# Patient Record
Sex: Female | Born: 1935 | Race: White | Hispanic: No | Smoking: Never smoker
Health system: Southern US, Community
[De-identification: ages and names within clinical notes are randomized; demographics above are authoritative.]

## PROBLEM LIST (undated history)

## (undated) DIAGNOSIS — C50919 Malignant neoplasm of unspecified site of unspecified female breast: Secondary | ICD-10-CM

## (undated) DIAGNOSIS — K5790 Diverticulosis of intestine, part unspecified, without perforation or abscess without bleeding: Secondary | ICD-10-CM

## (undated) DIAGNOSIS — C801 Malignant (primary) neoplasm, unspecified: Secondary | ICD-10-CM

## (undated) DIAGNOSIS — Z0000 Encounter for general adult medical examination without abnormal findings: Secondary | ICD-10-CM

## (undated) DIAGNOSIS — E785 Hyperlipidemia, unspecified: Secondary | ICD-10-CM

## (undated) DIAGNOSIS — H409 Unspecified glaucoma: Secondary | ICD-10-CM

## (undated) DIAGNOSIS — K635 Polyp of colon: Secondary | ICD-10-CM

## (undated) DIAGNOSIS — E039 Hypothyroidism, unspecified: Secondary | ICD-10-CM

## (undated) DIAGNOSIS — I10 Essential (primary) hypertension: Secondary | ICD-10-CM

## (undated) HISTORY — DX: Essential (primary) hypertension: I10

## (undated) HISTORY — DX: Hyperlipidemia, unspecified: E78.5

## (undated) HISTORY — DX: Polyp of colon: K63.5

## (undated) HISTORY — DX: Encounter for general adult medical examination without abnormal findings: Z00.00

## (undated) HISTORY — DX: Unspecified glaucoma: H40.9

## (undated) HISTORY — DX: Malignant (primary) neoplasm, unspecified: C80.1

## (undated) HISTORY — DX: Hypothyroidism, unspecified: E03.9

## (undated) HISTORY — PX: BREAST LUMPECTOMY: SHX2

## (undated) HISTORY — PX: THYROIDECTOMY: SHX17

## (undated) HISTORY — DX: Diverticulosis of intestine, part unspecified, without perforation or abscess without bleeding: K57.90

## (undated) HISTORY — DX: Malignant neoplasm of unspecified site of unspecified female breast: C50.919

---

## 2004-05-19 ENCOUNTER — Encounter: Payer: Self-pay | Admitting: Internal Medicine

## 2007-03-27 LAB — CONVERTED CEMR LAB: Pap Smear: NORMAL

## 2007-04-02 LAB — HM COLONOSCOPY: HM Colonoscopy: NORMAL

## 2008-05-03 ENCOUNTER — Encounter (INDEPENDENT_AMBULATORY_CARE_PROVIDER_SITE_OTHER): Payer: Self-pay | Admitting: *Deleted

## 2008-07-15 ENCOUNTER — Encounter (INDEPENDENT_AMBULATORY_CARE_PROVIDER_SITE_OTHER): Payer: Self-pay | Admitting: *Deleted

## 2008-07-22 ENCOUNTER — Encounter (INDEPENDENT_AMBULATORY_CARE_PROVIDER_SITE_OTHER): Payer: Self-pay | Admitting: *Deleted

## 2008-07-30 ENCOUNTER — Encounter (INDEPENDENT_AMBULATORY_CARE_PROVIDER_SITE_OTHER): Payer: Self-pay | Admitting: *Deleted

## 2008-08-26 ENCOUNTER — Ambulatory Visit: Payer: Self-pay | Admitting: *Deleted

## 2008-08-26 DIAGNOSIS — C50919 Malignant neoplasm of unspecified site of unspecified female breast: Secondary | ICD-10-CM | POA: Insufficient documentation

## 2008-08-26 DIAGNOSIS — C73 Malignant neoplasm of thyroid gland: Secondary | ICD-10-CM

## 2008-08-26 DIAGNOSIS — K5730 Diverticulosis of large intestine without perforation or abscess without bleeding: Secondary | ICD-10-CM | POA: Insufficient documentation

## 2008-08-26 DIAGNOSIS — D126 Benign neoplasm of colon, unspecified: Secondary | ICD-10-CM | POA: Insufficient documentation

## 2008-08-26 DIAGNOSIS — I10 Essential (primary) hypertension: Secondary | ICD-10-CM

## 2008-08-26 DIAGNOSIS — E039 Hypothyroidism, unspecified: Secondary | ICD-10-CM | POA: Insufficient documentation

## 2008-08-26 DIAGNOSIS — E782 Mixed hyperlipidemia: Secondary | ICD-10-CM | POA: Insufficient documentation

## 2008-08-30 DIAGNOSIS — E876 Hypokalemia: Secondary | ICD-10-CM | POA: Insufficient documentation

## 2008-08-30 LAB — CONVERTED CEMR LAB
CO2: 34 meq/L — ABNORMAL HIGH (ref 19–32)
GFR calc Af Amer: 90 mL/min
Glucose, Bld: 79 mg/dL (ref 70–99)
Potassium: 3.3 meq/L — ABNORMAL LOW (ref 3.5–5.1)
Sodium: 140 meq/L (ref 135–145)

## 2008-08-31 DIAGNOSIS — D351 Benign neoplasm of parathyroid gland: Secondary | ICD-10-CM | POA: Insufficient documentation

## 2008-08-31 DIAGNOSIS — M810 Age-related osteoporosis without current pathological fracture: Secondary | ICD-10-CM

## 2008-09-14 ENCOUNTER — Ambulatory Visit: Payer: Self-pay | Admitting: *Deleted

## 2008-09-14 LAB — CONVERTED CEMR LAB: Potassium: 4 meq/L (ref 3.5–5.1)

## 2008-09-16 ENCOUNTER — Telehealth (INDEPENDENT_AMBULATORY_CARE_PROVIDER_SITE_OTHER): Payer: Self-pay | Admitting: *Deleted

## 2008-09-17 ENCOUNTER — Telehealth (INDEPENDENT_AMBULATORY_CARE_PROVIDER_SITE_OTHER): Payer: Self-pay | Admitting: *Deleted

## 2008-10-05 ENCOUNTER — Ambulatory Visit: Payer: Self-pay | Admitting: Hematology & Oncology

## 2008-10-15 ENCOUNTER — Encounter: Payer: Self-pay | Admitting: Internal Medicine

## 2008-10-15 LAB — CBC WITH DIFFERENTIAL (CANCER CENTER ONLY)
BASO#: 0 10*3/uL (ref 0.0–0.2)
BASO%: 0.3 % (ref 0.0–2.0)
EOS%: 1.6 % (ref 0.0–7.0)
HCT: 38.8 % (ref 34.8–46.6)
LYMPH#: 0.6 10*3/uL — ABNORMAL LOW (ref 0.9–3.3)
LYMPH%: 15.6 % (ref 14.0–48.0)
MCH: 31.6 pg (ref 26.0–34.0)
MCHC: 34.5 g/dL (ref 32.0–36.0)
MONO%: 9.7 % (ref 0.0–13.0)
NEUT%: 72.8 % (ref 39.6–80.0)
RDW: 12.2 % (ref 10.5–14.6)

## 2008-10-18 ENCOUNTER — Telehealth (INDEPENDENT_AMBULATORY_CARE_PROVIDER_SITE_OTHER): Payer: Self-pay | Admitting: *Deleted

## 2008-10-18 LAB — COMPREHENSIVE METABOLIC PANEL
ALT: 14 U/L (ref 0–35)
Alkaline Phosphatase: 54 U/L (ref 39–117)
Sodium: 136 mEq/L (ref 135–145)
Total Bilirubin: 0.3 mg/dL (ref 0.3–1.2)
Total Protein: 6.8 g/dL (ref 6.0–8.3)

## 2008-10-19 ENCOUNTER — Ambulatory Visit (HOSPITAL_BASED_OUTPATIENT_CLINIC_OR_DEPARTMENT_OTHER): Admission: RE | Admit: 2008-10-19 | Discharge: 2008-10-19 | Payer: Self-pay | Admitting: Internal Medicine

## 2008-10-19 ENCOUNTER — Ambulatory Visit: Payer: Self-pay | Admitting: Diagnostic Radiology

## 2008-10-19 ENCOUNTER — Ambulatory Visit: Payer: Self-pay | Admitting: Internal Medicine

## 2008-10-19 ENCOUNTER — Telehealth: Payer: Self-pay | Admitting: Internal Medicine

## 2008-10-19 DIAGNOSIS — R930 Abnormal findings on diagnostic imaging of skull and head, not elsewhere classified: Secondary | ICD-10-CM

## 2008-10-19 DIAGNOSIS — R059 Cough, unspecified: Secondary | ICD-10-CM | POA: Insufficient documentation

## 2008-10-19 DIAGNOSIS — R05 Cough: Secondary | ICD-10-CM

## 2008-10-21 ENCOUNTER — Telehealth: Payer: Self-pay | Admitting: Internal Medicine

## 2008-10-21 ENCOUNTER — Encounter: Admission: RE | Admit: 2008-10-21 | Discharge: 2008-10-21 | Payer: Self-pay | Admitting: Internal Medicine

## 2008-11-23 ENCOUNTER — Ambulatory Visit: Payer: Self-pay | Admitting: Family Medicine

## 2008-11-23 LAB — CONVERTED CEMR LAB
ALT: 12 units/L (ref 0–35)
AST: 17 units/L (ref 0–37)
Alkaline Phosphatase: 58 units/L (ref 39–117)
Basophils Relative: 0.4 % (ref 0.0–3.0)
CO2: 32 meq/L (ref 19–32)
Creatinine, Ser: 0.9 mg/dL (ref 0.4–1.2)
Eosinophils Relative: 0.7 % (ref 0.0–5.0)
GFR calc non Af Amer: 64.85 mL/min (ref >60)
HDL: 50.7 mg/dL (ref >39.00)
LDL Cholesterol: 98 mg/dL (ref 0–99)
Lymphocytes Relative: 18.8 % (ref 12.0–46.0)
Monocytes Absolute: 0.4 10*3/uL (ref 0.1–1.0)
Monocytes Relative: 7.5 % (ref 3.0–12.0)
Neutrophils Relative %: 72.6 % (ref 43.0–77.0)
Platelets: 261 10*3/uL (ref 150.0–400.0)
RBC: 4.45 M/uL (ref 3.87–5.11)
Sodium: 141 meq/L (ref 135–145)
TSH: 0.11 microintl units/mL — ABNORMAL LOW (ref 0.35–5.50)
Total Bilirubin: 0.9 mg/dL (ref 0.3–1.2)
Total CHOL/HDL Ratio: 3
Total Protein: 7.4 g/dL (ref 6.0–8.3)
VLDL: 19.2 mg/dL (ref 0.0–40.0)
WBC: 5.6 10*3/uL (ref 4.5–10.5)

## 2009-02-06 ENCOUNTER — Inpatient Hospital Stay (HOSPITAL_COMMUNITY): Admission: EM | Admit: 2009-02-06 | Discharge: 2009-02-07 | Payer: Self-pay

## 2009-03-23 ENCOUNTER — Ambulatory Visit: Payer: Self-pay | Admitting: Hematology & Oncology

## 2009-03-24 ENCOUNTER — Encounter: Payer: Self-pay | Admitting: Internal Medicine

## 2009-03-24 LAB — COMPREHENSIVE METABOLIC PANEL
Albumin: 4 g/dL (ref 3.5–5.2)
CO2: 28 mEq/L (ref 19–32)
Chloride: 103 mEq/L (ref 96–112)
Glucose, Bld: 90 mg/dL (ref 70–99)
Potassium: 4.2 mEq/L (ref 3.5–5.3)
Sodium: 141 mEq/L (ref 135–145)
Total Protein: 7.1 g/dL (ref 6.0–8.3)

## 2009-03-24 LAB — CBC WITH DIFFERENTIAL (CANCER CENTER ONLY)
BASO#: 0 10*3/uL (ref 0.0–0.2)
BASO%: 0.7 % (ref 0.0–2.0)
HCT: 41.7 % (ref 34.8–46.6)
HGB: 13.9 g/dL (ref 11.6–15.9)
LYMPH%: 23.9 % (ref 14.0–48.0)
MCHC: 33.2 g/dL (ref 32.0–36.0)
MCV: 95 fL (ref 81–101)
MONO#: 0.3 10*3/uL (ref 0.1–0.9)
NEUT%: 66.8 % (ref 39.6–80.0)
RDW: 11.3 % (ref 10.5–14.6)
WBC: 5.2 10*3/uL (ref 3.9–10.0)

## 2009-03-24 LAB — VITAMIN D 25 HYDROXY (VIT D DEFICIENCY, FRACTURES): Vit D, 25-Hydroxy: 42 ng/mL (ref 30–89)

## 2009-03-29 ENCOUNTER — Ambulatory Visit: Payer: Self-pay | Admitting: Family Medicine

## 2009-03-29 DIAGNOSIS — H6120 Impacted cerumen, unspecified ear: Secondary | ICD-10-CM

## 2009-03-29 DIAGNOSIS — IMO0002 Reserved for concepts with insufficient information to code with codable children: Secondary | ICD-10-CM

## 2009-03-30 LAB — CONVERTED CEMR LAB
BUN: 16 mg/dL (ref 6–23)
Chloride: 102 meq/L (ref 96–112)
Glucose, Bld: 85 mg/dL (ref 70–99)
Potassium: 4.2 meq/L (ref 3.5–5.3)
Sodium: 141 meq/L (ref 135–145)
TSH: 2.743 microintl units/mL (ref 0.350–4.500)

## 2009-04-08 ENCOUNTER — Encounter: Payer: Self-pay | Admitting: Internal Medicine

## 2009-04-09 ENCOUNTER — Encounter: Payer: Self-pay | Admitting: Internal Medicine

## 2009-04-13 ENCOUNTER — Encounter: Admission: RE | Admit: 2009-04-13 | Discharge: 2009-04-13 | Payer: Self-pay | Admitting: Hematology & Oncology

## 2009-04-18 ENCOUNTER — Telehealth (INDEPENDENT_AMBULATORY_CARE_PROVIDER_SITE_OTHER): Payer: Self-pay | Admitting: *Deleted

## 2009-07-11 ENCOUNTER — Ambulatory Visit: Payer: Self-pay | Admitting: Internal Medicine

## 2009-07-11 ENCOUNTER — Ambulatory Visit: Payer: Self-pay | Admitting: Diagnostic Radiology

## 2009-07-11 ENCOUNTER — Telehealth: Payer: Self-pay | Admitting: Internal Medicine

## 2009-07-11 ENCOUNTER — Ambulatory Visit (HOSPITAL_BASED_OUTPATIENT_CLINIC_OR_DEPARTMENT_OTHER): Admission: RE | Admit: 2009-07-11 | Discharge: 2009-07-11 | Payer: Self-pay | Admitting: Internal Medicine

## 2009-07-11 DIAGNOSIS — G5600 Carpal tunnel syndrome, unspecified upper limb: Secondary | ICD-10-CM

## 2009-07-11 LAB — CONVERTED CEMR LAB
Albumin: 4.3 g/dL (ref 3.5–5.2)
Alkaline Phosphatase: 57 units/L (ref 39–117)
Bilirubin, Direct: 0.1 mg/dL (ref 0.0–0.3)
CO2: 25 meq/L (ref 19–32)
Calcium: 9.6 mg/dL (ref 8.4–10.5)
Chloride: 98 meq/L (ref 96–112)
Creatinine, Ser: 0.87 mg/dL (ref 0.40–1.20)
Glucose, Bld: 78 mg/dL (ref 70–99)
HDL: 55 mg/dL (ref >39)
LDL Cholesterol: 108 mg/dL — ABNORMAL HIGH (ref 0–99)
TSH: 4.835 microintl units/mL — ABNORMAL HIGH (ref 0.350–4.500)
Total Bilirubin: 0.5 mg/dL (ref 0.3–1.2)
Total CHOL/HDL Ratio: 3.4

## 2009-07-13 ENCOUNTER — Encounter: Payer: Self-pay | Admitting: Internal Medicine

## 2009-07-13 ENCOUNTER — Telehealth: Payer: Self-pay | Admitting: Internal Medicine

## 2009-08-13 IMAGING — CR DG CHEST 2V
2 series · 2 of 2 positions shown · non-contrast
Comparison: None

CLINICAL DATA: Persistent cough for 3 weeks.

CHEST - 2 VIEW

[w chest pa]
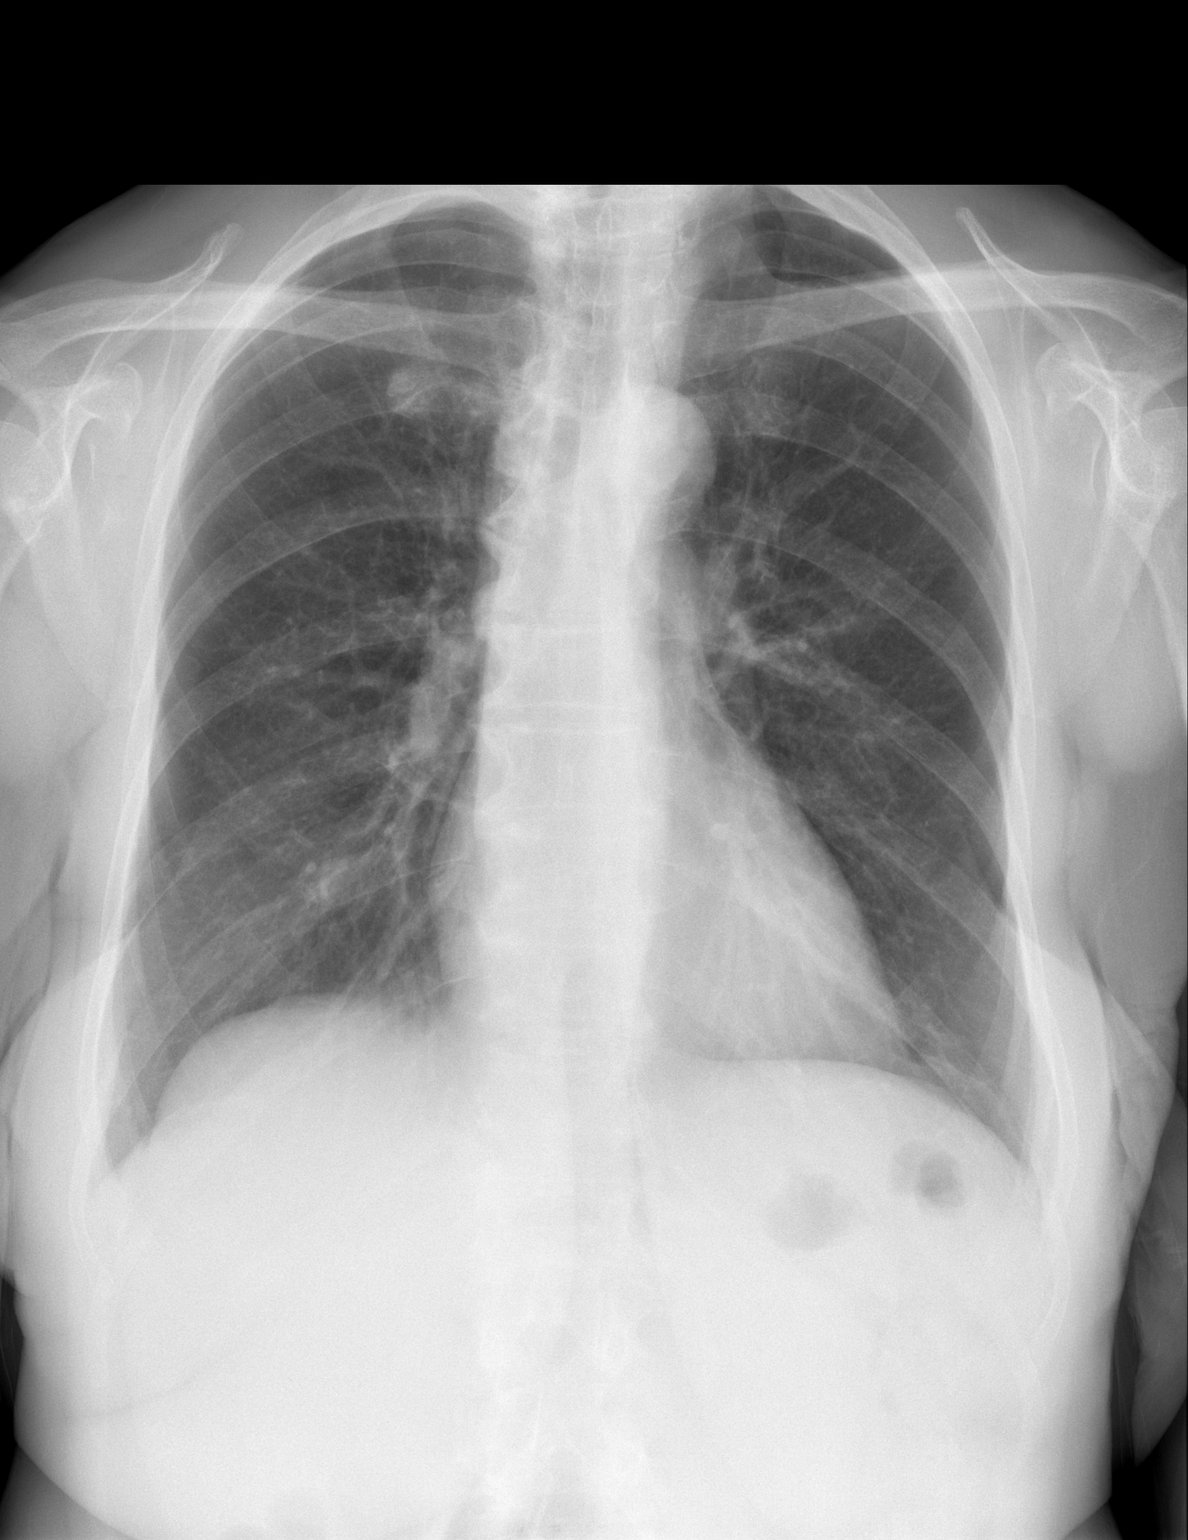

[w chest lat]
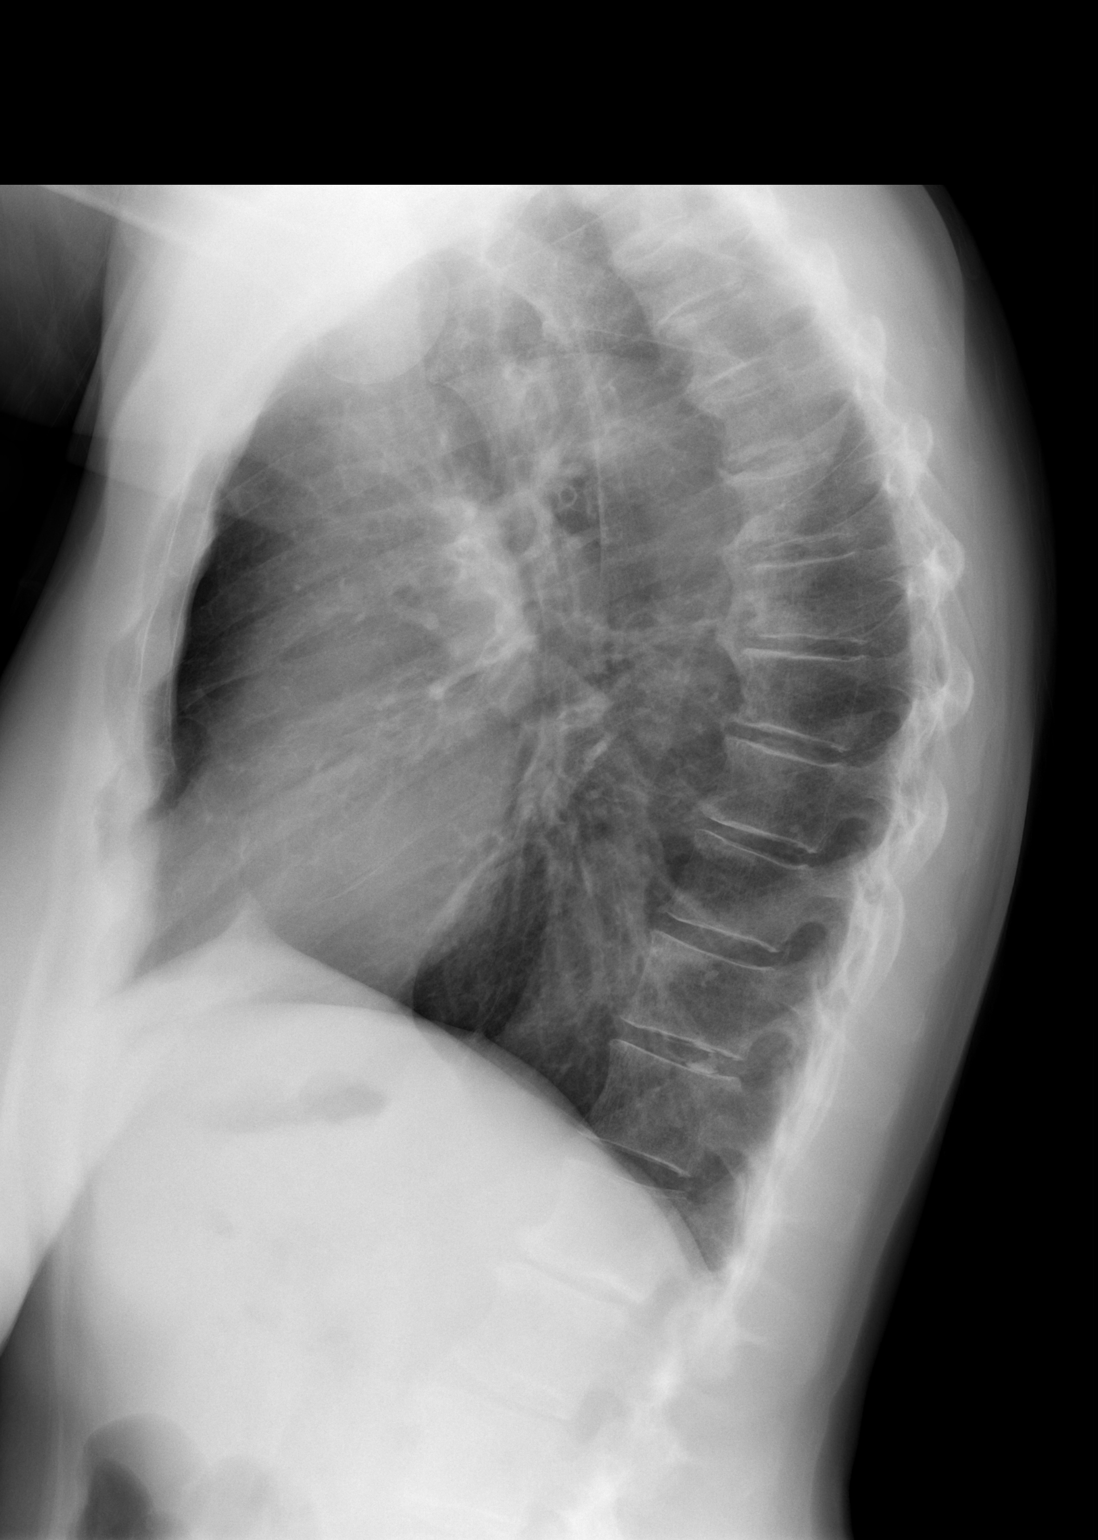

[2 of 2 positions shown; findings below may reference images not displayed]

FINDINGS: Cardiac and mediastinal contours appear unremarkable.

There is linear subsegmental atelectasis at the left lung base.
Thoracic spondylosis noted.

There is a suggestion of vague nodularity between the anterior
portions of the right fourth and fifth ribs.  A 7 mm pulmonary
nodule in this location cannot be readily excluded.  This is too
high in position to be a site of nipple shadow.  Given the lack of
prior exams, chest CT may be warranted for further
characterization.

The lungs appear otherwise clear.
IMPRESSION: 1.  7 mm nodular density projects peripherally in the right mid
lung.  A true pulmonary nodule cannot be readily excluded -
consider chest CT for further characterization.
2.  Left basilar subsegmental atelectasis.
3.  Thoracic spondylosis.

## 2009-09-08 ENCOUNTER — Ambulatory Visit: Payer: Self-pay | Admitting: Internal Medicine

## 2009-09-09 ENCOUNTER — Telehealth: Payer: Self-pay | Admitting: Internal Medicine

## 2009-09-20 ENCOUNTER — Ambulatory Visit: Payer: Self-pay | Admitting: Hematology & Oncology

## 2009-09-21 ENCOUNTER — Encounter: Payer: Self-pay | Admitting: Internal Medicine

## 2009-10-18 ENCOUNTER — Encounter: Admission: RE | Admit: 2009-10-18 | Discharge: 2009-10-18 | Payer: Self-pay | Admitting: Hematology & Oncology

## 2010-01-05 ENCOUNTER — Telehealth: Payer: Self-pay | Admitting: Internal Medicine

## 2010-01-19 ENCOUNTER — Telehealth: Payer: Self-pay | Admitting: Internal Medicine

## 2010-01-19 ENCOUNTER — Ambulatory Visit (HOSPITAL_BASED_OUTPATIENT_CLINIC_OR_DEPARTMENT_OTHER): Admission: RE | Admit: 2010-01-19 | Discharge: 2010-01-19 | Payer: Self-pay | Admitting: Internal Medicine

## 2010-01-19 ENCOUNTER — Ambulatory Visit: Payer: Self-pay | Admitting: Interventional Radiology

## 2010-01-19 ENCOUNTER — Ambulatory Visit: Payer: Self-pay | Admitting: Internal Medicine

## 2010-01-19 LAB — CONVERTED CEMR LAB
Chloride: 102 meq/L (ref 96–112)
Potassium: 4.2 meq/L (ref 3.5–5.3)
Sodium: 140 meq/L (ref 135–145)
TSH: 0.113 microintl units/mL — ABNORMAL LOW (ref 0.350–4.500)

## 2010-01-20 ENCOUNTER — Telehealth: Payer: Self-pay | Admitting: Internal Medicine

## 2010-01-20 ENCOUNTER — Encounter: Payer: Self-pay | Admitting: Internal Medicine

## 2010-01-23 ENCOUNTER — Encounter: Payer: Self-pay | Admitting: Internal Medicine

## 2010-01-24 ENCOUNTER — Ambulatory Visit: Payer: Self-pay | Admitting: Internal Medicine

## 2010-01-24 DIAGNOSIS — T148XXA Other injury of unspecified body region, initial encounter: Secondary | ICD-10-CM | POA: Insufficient documentation

## 2010-03-03 ENCOUNTER — Encounter: Payer: Self-pay | Admitting: Internal Medicine

## 2010-03-03 LAB — CONVERTED CEMR LAB: TSH: 0.147 microintl units/mL — ABNORMAL LOW (ref 0.350–4.500)

## 2010-03-07 ENCOUNTER — Telehealth: Payer: Self-pay | Admitting: Internal Medicine

## 2010-03-08 ENCOUNTER — Telehealth: Payer: Self-pay | Admitting: Internal Medicine

## 2010-04-14 ENCOUNTER — Encounter: Admission: RE | Admit: 2010-04-14 | Discharge: 2010-04-14 | Payer: Self-pay | Admitting: Hematology & Oncology

## 2010-04-14 LAB — HM MAMMOGRAPHY: HM Mammogram: ABNORMAL

## 2010-04-24 ENCOUNTER — Ambulatory Visit: Payer: Self-pay | Admitting: Hematology & Oncology

## 2010-04-26 ENCOUNTER — Encounter: Payer: Self-pay | Admitting: Internal Medicine

## 2010-05-09 ENCOUNTER — Telehealth: Payer: Self-pay | Admitting: Internal Medicine

## 2010-07-17 ENCOUNTER — Ambulatory Visit
Admission: RE | Admit: 2010-07-17 | Discharge: 2010-07-17 | Payer: Self-pay | Source: Home / Self Care | Attending: Internal Medicine | Admitting: Internal Medicine

## 2010-07-19 ENCOUNTER — Telehealth: Payer: Self-pay | Admitting: Internal Medicine

## 2010-07-19 LAB — CONVERTED CEMR LAB
ALT: 11 units/L (ref 0–35)
Albumin: 4.1 g/dL (ref 3.5–5.2)
Alkaline Phosphatase: 53 units/L (ref 39–117)
Chloride: 103 meq/L (ref 96–112)
Cholesterol: 175 mg/dL (ref 0–200)
Creatinine, Ser: 0.95 mg/dL (ref 0.40–1.20)
HDL: 46 mg/dL (ref >39)
LDL Cholesterol: 107 mg/dL — ABNORMAL HIGH (ref 0–99)
Potassium: 4.4 meq/L (ref 3.5–5.3)
Total Protein: 7 g/dL (ref 6.0–8.3)
Triglycerides: 112 mg/dL (ref <150)

## 2010-07-23 ENCOUNTER — Encounter: Payer: Self-pay | Admitting: Internal Medicine

## 2010-07-24 ENCOUNTER — Encounter: Payer: Self-pay | Admitting: Internal Medicine

## 2010-08-01 NOTE — Progress Notes (Signed)
  Phone Note Outgoing Call   Summary of Call: call pt - TSH now normal.  continue same dose of thyroid medication an informed her Synthroid was called int [facility] Initial call taken by: D. Thomos Lemons DO,  September 09, 2009 8:42 AM  Follow-up for Phone Call        informed pt. that TSH is now normal and to continue same dose of Thyroid Mediction. informed her med. was called in to [facility] Follow-up by: Michaelle Copas,  September 09, 2009 10:24 AM    Prescriptions: SYNTHROID 100 MCG TABS (LEVOTHYROXINE SODIUM) one by mouth once daily  #90 x 1   Entered and Authorized by:   D. Thomos Lemons DO   Signed by:   D. Thomos Lemons DO on 09/09/2009   Method used:   Electronically to        [facility] (retail)       [address]       [place], [place]  [address]       Ph: [phone] or [phone]       Fax: [phone]   RxID:   [id number]

## 2010-08-01 NOTE — Progress Notes (Signed)
Summary: Enalapril Refill  Phone Note Refill Request Call back at Home Phone [phone] Message from:  Patient on January 05, 2010 10:51 AM  Refills Requested: Medication #1:  ENALAPRIL MALEATE 20 MG TABS Take 1 tablet by mouth twice a day   Dosage confirmed as above?Dosage Confirmed Pt states [name] [address] does not have refill, she will be out of med tomorrow 7.8.11, pls leave mess for pt when Rx called into pharmacy  Initial call taken by: Lannette Donath,  January 05, 2010 10:52 AM Caller: Patient Reason for Call: Refill Medication, Talk to Nurse  Follow-up for Phone Call        call returned to patient, she was informed rx sent to pharmacy, and future refills would need to obtained at office visit. Patient verbalized understanding and agrees.  Follow-up by: Glendell Docker CMA,  January 05, 2010 10:58 AM    Prescriptions: ENALAPRIL MALEATE 20 MG TABS (ENALAPRIL MALEATE) Take 1 tablet by mouth twice a day  #60 x 0   Entered by:   Glendell Docker CMA   Authorized by:   D. Thomos Lemons DO   Signed by:   Glendell Docker CMA on 01/05/2010   Method used:   Electronically to        [facility] (retail)       [address]       [place], [place]  [address]       Ph: [phone] or [phone]       Fax: [phone]   RxID:   [id number]

## 2010-08-01 NOTE — Progress Notes (Signed)
Summary: aleve with her rx   Phone Note Call from Patient Call back at [facility] Phone [phone]   Caller: Patient Call For: yoo  Summary of Call: She has been having some low back pain.  Would like to take  over the counter Alave.  Wants to know if that is ok with the medication she takes.  Initial call taken by: Roselle Locus,  March 08, 2010 10:08 AM  Follow-up for Phone Call        tylenol would be safer to take  Follow-up by: D. Thomos Lemons DO,  March 09, 2010 5:05 PM  Additional Follow-up for Phone Call Additional follow up Details #1::        call placed to patient at [phone], she has been advised per Dr Artist Pais instructions Additional Follow-up by: Glendell Docker CMA,  March 09, 2010 5:13 PM

## 2010-08-01 NOTE — Letter (Signed)
   [place] [facility] [address] [place], [place]  [address]  [place] Phone: [phone]      July 13, 2009   ROSE Haughton [address] [place], [place] [address]  RE:  LAB RESULTS  Dear  Ms. Zieske,  The following is an interpretation of your most recent lab tests.  Please take note of any instructions provided or changes to medications that have resulted from your lab work.  ELECTROLYTES:  Good - no changes needed  KIDNEY FUNCTION TESTS:  Good - no changes needed  LIVER FUNCTION TESTS:  Good - no changes needed  LIPID PANEL:  Good - no changes needed Triglyceride: 110   Cholesterol: 185   LDL: 108   HDL: 55   Chol/HDL%:  3.4 Ratio  THYROID STUDIES:  Thyroid level too low TSH: 4.835          Sincerely Yours,    Dr. Thomos Lemons

## 2010-08-01 NOTE — Progress Notes (Signed)
Summary: Thyroid Results  Phone Note Outgoing Call   Summary of Call: call pt - blood test shows she is taking too much thyroid replacement medication.  see new rx for lower dose return in 2 months for TSH 244.9 Initial call taken by: D. Thomos Lemons DO,  January 20, 2010 9:06 AM  Follow-up for Phone Call        attempted to contact patient at [phone], no answer. A detailed voice message was left informing patient per Dr Artist Pais instructions. She was advised to call if any questions. Lab order for September in [facility] entered Follow-up by: Glendell Docker CMA,  January 20, 2010 9:18 AM    New/Updated Medications: LEVOTHYROXINE SODIUM 88 MCG TABS (LEVOTHYROXINE SODIUM) one by mouth once daily Prescriptions: LEVOTHYROXINE SODIUM 88 MCG TABS (LEVOTHYROXINE SODIUM) one by mouth once daily  #90 x 1   Entered and Authorized by:   D. Thomos Lemons DO   Signed by:   D. Thomos Lemons DO on 01/20/2010   Method used:   Electronically to        [facility] (retail)       [address]       [place], [place]  [address]       Ph: [phone] or [phone]       Fax: [phone]   RxID:   [id number]

## 2010-08-01 NOTE — Assessment & Plan Note (Signed)
Summary: 4 MONTH FOLLOW UP/[facility]   Vital Signs:  Patient profile:   77 year old female Weight:      128.50 pounds BMI:     23.59 O2 Sat:      97 % on Room air Temp:     97.8 degrees F oral Pulse rate:   68 / minute Pulse rhythm:   regular Resp:     16 per minute BP sitting:   104 / 70  (right arm) Cuff size:   regular  Vitals Entered By: Glendell Docker CMA (July 11, 2009 9:51 AM)  O2 Flow:  Room air  Primary Care Provider:  D. Thomos Lemons DO  CC:  4 Month Follow up .  History of Present Illness: 4 Month Follow up  77 y/o female for hosptial f/u.  She was admitted to [facility] for left sided numbness and tingling.  MRI of brain was negative for acute changes.  during hosp - cxr was performed.  pt reports question of cxr abnormality.  she denies chronic cough.  no chest pain.   Preventive Screening-Counseling & Management  Alcohol-Tobacco     Smoking Status: never  Allergies: 1)  ! 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   Eyes:  pupils equal, pupils round, and pupils reactive to light.   Lungs:  Normal respiratory effort, chest expands symmetrically. Lungs are clear to auscultation, no crackles or wheezes. Heart:  normal rate, regular rhythm, and no murmur.   Abdomen:  soft and non-tender.   Extremities:  no UE or LE edema Neurologic:  no tremor gait normal   Impression & Recommendations:  Problem #  1:  CARPAL TUNNEL SYNDROME, LEFT (ICD-354.0) I suspect left hand numbness and tingling from poss CTS.  Pt to try using wrist splint at night.  Patient advised to call office if symptoms persist or worsen.  Problem # 2:  ABNORMAL CHEST XRAY (ICD-793.1) Pt reports CXR abnormality.  repeat CXR .  obtain copy of prev report.  Orders: T-2 View CXR, Same Day ([id number])  Problem # 3:  HYPERLIPIDEMIA (ICD-272.4) monitor labs  Her updated medication list for this problem includes:    Simvastatin 40 Mg Tabs (Simvastatin) .Marland Kitchen... Take 1/2 tablet by mouth once a day  Orders: T-Lipid Profile [id number]) T-Hepatic Function [id number])  Labs Reviewed: SGOT: 17 (11/23/2008)   SGPT: 12 (11/23/2008)   HDL:50.70 (11/23/2008)  LDL:98 (11/23/2008)  Chol:168 (11/23/2008)  Trig:96.0 (11/23/2008)  Complete Medication List: 1)  Synthroid 100 Mcg Tabs (Levothyroxine sodium) .... One by mouth once daily 2)  Alendronate Sodium 70 Mg Tabs (Alendronate sodium) .... Take 1 tab once weekly 3)  Maxzide-25 37.5-25 Mg Tabs (Triamterene-hctz) .Marland Kitchen.. 1 tab  by mouth daily 4)  Enalapril Maleate 20 Mg Tabs (Enalapril maleate) .... Take 1 tablet by mouth twice a day 5)  Simvastatin 40 Mg Tabs (Simvastatin) .... Take 1/2 tablet by mouth once a day 6)  Femara 2.5 Mg Tabs (Letrozole) .... Take 1 tablet by mouth once a day 7)  Bayer Aspirin Ec Low Dose 81  Mg Tbec (Aspirin) .... Take 1 tablet by mouth once a day 8)  D 400 Caps (Vitamins a & d) .... 4 tablets daily 9)  Calcium Carbonate 600 Mg Tabs (Calcium carbonate) .... Take 1 tablet by mouth two times a day  Other Orders: T-TSH ([id number]) T-Basic Metabolic Panel ([id number])  Patient Instructions: 1)  Use left wrist at night x 1 month 2)  Please schedule a follow-up appointment in 6 months. Prescriptions: SIMVASTATIN 40 MG TABS (SIMVASTATIN) Take 1/2 tablet by mouth once a day  #45 x 1   Entered and Authorized by:   D. Thomos Lemons DO   Signed by:   D. Thomos Lemons DO on 07/11/2009   Method used:   Electronically to        [facility] (retail)       [address]       [place], [place]  [address]       Ph: [phone] or [phone]       Fax: [phone]   RxID:   [id number] ALENDRONATE SODIUM 70 MG TABS (ALENDRONATE SODIUM) Take 1 tab once weekly  #4 x 5   Entered and Authorized by:   D. Thomos Lemons DO   Signed by:   D. Thomos Lemons DO on 07/11/2009   Method used:   Electronically to        [facility] (retail)       [address]       [place], [place]  [address]       Ph: [phone] or [phone]       Fax: [phone]   RxID:   [id number]   Current Allergies (reviewed today): ! SULFA   Preventive Care Screening  Mammogram:    Date:  04/19/2009    Results:  Done   Last Flu Shot:    Date:  03/28/2009    Results:  given   Bone Density:    Date:  05/20/2007    Results:  normal std dev

## 2010-08-01 NOTE — Progress Notes (Signed)
Summary: Test Results  Phone Note Outgoing Call   Summary of Call: call pt - neck u/s normal Initial call taken by: D. Thomos Lemons DO,  January 19, 2010 1:08 PM  Follow-up for Phone Call        call placed to patient at [phone], no answer. A detailed voice message was left informing patient per Dr Artist Pais instructions.  Patient was advised to call if any questions Follow-up by: Glendell Docker CMA,  January 19, 2010 1:53 PM

## 2010-08-01 NOTE — Assessment & Plan Note (Signed)
Summary: shingle shot reaction/dt   Vital Signs:  Patient profile:   78 year old female O2 Sat:      97 % on Room air Temp:     98.4 degrees F oral Pulse rate:   76 / minute Pulse rhythm:   regular Resp:     16 per minute BP sitting:   120 / 70  (right arm) Cuff size:   regular  Vitals Entered By: Glendell Docker CMA (January 24, 2010 4:00 PM)  O2 Flow:  Room air CC: Rm 2- Evaluation of Bruise Is Patient Diabetic? No Pain Assessment Patient in pain? no        Primary Care Provider:  Dondra Spry DO  CC:  Rm 2- Evaluation of Bruise.  History of Present Illness: 77 y/o received Zostavax on Friday and noticed bruising of left arm on Saturday. She denies pain, but states site is warm to touch. she has been using cool compress.  no joint paints.  no fever  Allergies: 1)  ! 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   Skin:  4-6cm bruise on left mid upper arm. area in non tender,  faint redness at injection site    Impression & Recommendations:  Problem # 1:  HEMATOMA (ICD-924.9) pt developed left arm bruise and local rxn after zostavax. continue cool compress.  call office if bruising or redness gets worse.  Complete Medication List: 1)  Levothyroxine Sodium 88 Mcg Tabs (Levothyroxine sodium) .... One by mouth once daily 2)  Alendronate Sodium 70 Mg Tabs (Alendronate sodium) .... Take 1 tab once weekly 3)   Maxzide-25 37.5-25 Mg Tabs (Triamterene-hctz) .Marland Kitchen.. 1 tab  by mouth daily 4)  Enalapril Maleate 20 Mg Tabs (Enalapril maleate) .... Take 1 tablet by mouth twice a day 5)  Simvastatin 40 Mg Tabs (Simvastatin) .... Take 1/2 tablet by mouth once a day 6)  Femara 2.5 Mg Tabs (Letrozole) .... Take 1 tablet by mouth once a day 7)  Bayer Aspirin Ec Low Dose 81 Mg Tbec (Aspirin) .... Take 1 tablet by mouth once a day 8)  D 400 Caps (Vitamins a & d) .... 4 tablets daily 9)  Calcium Carbonate 600 Mg Tabs (Calcium carbonate) .... Take 1 tablet by mouth two times a day 10)  Zostavax 09811 Unt/0.70ml Solr (Zoster vaccine live) .... Administer vaccine x 1

## 2010-08-01 NOTE — Progress Notes (Signed)
  Phone Note Outgoing Call   Summary of Call: call pt - chest xray is stable.  evidence of healed rib fractures Initial call taken by: D. Thomos Lemons DO,  July 11, 2009 12:11 PM  Follow-up for Phone Call        informed pt. that cxr is stable & that prior rib fx. are healed. Follow-up by: Michaelle Copas,  July 11, 2009 1:50 PM

## 2010-08-01 NOTE — Miscellaneous (Signed)
Summary: Zostavax  Clinical Lists Changes  Observations: Added new observation of ZOSTAVAX: Zostavax (01/20/2010 16:49)      Immunization History:  Zostavax History:    Zostavax # 1:  zostavax (01/20/2010)

## 2010-08-01 NOTE — Op Note (Signed)
Summary: Thyroid Surgery/[facility]  Thyroid Surgery/[facility]   Imported By: Lanelle Bal 01/27/2010 13:18:29  _____________________________________________________________________  External Attachment:    Type:   Image     Comment:   External Document

## 2010-08-01 NOTE — Progress Notes (Signed)
Summary: Lab Results  Phone Note Outgoing Call   Summary of Call: call pt - blood test shows pt still needs lower dose of thyroid medication. see new rx.  plz arrange repeat TSH in 2 months Initial call taken by: D. Thomos Lemons DO,  March 07, 2010 1:24 PM  Follow-up for Phone Call        call placed to patient at [phone], no answer. A voice message was left for patient to return call regarding blood work. Follow-up by: Glendell Docker CMA,  March 07, 2010 4:42 PM  Additional Follow-up for Phone Call Additional follow up Details #1::        PATIENT CAME IN OFFICE GAVE HER THE INFORMATION ABOUT NEW RX  AND REPEAT TSH  Judy Norman  March 08, 2010 10:06 AM    New/Updated Medications: LEVOTHYROXINE SODIUM 75 MCG TABS (LEVOTHYROXINE SODIUM) one by mouth once daily Prescriptions: LEVOTHYROXINE SODIUM 75 MCG TABS (LEVOTHYROXINE SODIUM) one by mouth once daily  #30 x 2   Entered and Authorized by:   D. Thomos Lemons DO   Signed by:   D. Thomos Lemons DO on 03/07/2010   Method used:   Electronically to        [facility] (retail)       [address]       [place], [place]  [address]       Ph: [phone] or [phone]       Fax: [phone]   RxID:   [id number]

## 2010-08-01 NOTE — Letter (Signed)
[facility]   Imported By: Lanelle Bal 10/19/2009 08:34:11  _____________________________________________________________________  External Attachment:    Type:   Image     Comment:   External Document

## 2010-08-01 NOTE — Progress Notes (Signed)
Summary: Lab Results  Phone Note Outgoing Call   Summary of Call: call pt - blood test shows pt needs higher dose of thyroid replacement.  please confirm pt has not missed doses. see rx for higher dose.  she needs to return in 2 months for repeat TSH Initial call taken by: D. Thomos Lemons DO,  July 13, 2009 11:44 AM  Follow-up for Phone Call        call placed to patient at [phone] no answer, voice message left to return call regarding medication changes. Follow-up by: Glendell Docker CMA,  July 13, 2009 4:26 PM  Additional Follow-up for Phone Call Additional follow up Details #1::        attempted to contact patient at [phone] no answer, left voice message to return call Additional Follow-up by: Glendell Docker CMA,  July 14, 2009 4:15 PM    Additional Follow-up for Phone Call Additional follow up Details #2::    patient returned phone call and has been advised per Dr Artist Pais instructions Follow-up by: Glendell Docker CMA,  July 14, 2009 4:42 PM  New/Updated Medications: SYNTHROID 100 MCG TABS (LEVOTHYROXINE SODIUM) one by mouth once daily Prescriptions: SYNTHROID 100 MCG TABS (LEVOTHYROXINE SODIUM) one by mouth once daily  #90 x 0   Entered and Authorized by:   D. Thomos Lemons DO   Signed by:   D. Thomos Lemons DO on 07/13/2009   Method used:   Electronically to        [facility] (retail)       [address]       [place], [place]  [address]       Ph: [phone] or [phone]       Fax: [phone]   RxID:   [id number]

## 2010-08-01 NOTE — Miscellaneous (Signed)
Summary: Zostavax/[facility]  Zostavax/[facility]   Imported By: Lanelle Bal 01/27/2010 13:30:32  _____________________________________________________________________  External Attachment:    Type:   Image     Comment:   External Document

## 2010-08-01 NOTE — Letter (Signed)
Summary: [facility]  [facility]   Imported By: Lanelle Bal 05/16/2010 13:20:15  _____________________________________________________________________  External Attachment:    Type:   Image     Comment:   External Document

## 2010-08-01 NOTE — Progress Notes (Signed)
Summary: Lab Results & Medication Refill  Phone Note Other Incoming Call back at Home Phone [phone]   Caller: Patient was walk in to office Summary of Call: patient walked in to office today inquiring about her lab results and medication refill on her Thyroid medication  Follow-up for Phone Call        thyroid blood test is now normal.  continue same dose of thyroid medication.  repeat TSH in 6 months Follow-up by: D. Thomos Lemons DO,  May 09, 2010 1:25 PM  Additional Follow-up for Phone Call Additional follow up Details #1::        call placed to patient at [phone], no answer. A detailed voice message was left informing patient per Dr Artist Pais instructions. Message was left for patient to call if any questions. Lab order for Tsh has been entered for May 2012 Additional Follow-up by: Glendell Docker CMA,  May 09, 2010 2:51 PM    Prescriptions: LEVOTHYROXINE SODIUM 75 MCG TABS (LEVOTHYROXINE SODIUM) one by mouth once daily  #90 x 1   Entered and Authorized by:   D. Thomos Lemons DO   Signed by:   D. Thomos Lemons DO on 05/09/2010   Method used:   Electronically to        [facility] (retail)       [address]       [place], [place]  [address]       Ph: [phone] or [phone]       Fax: [phone]   RxID:   [id number]

## 2010-08-01 NOTE — Assessment & Plan Note (Signed)
Summary: 6 mo. f/u - jr rsc witgh pt from bump/mhf   Vital Signs:  Patient profile:   77 year old female Weight:      127.50 pounds BMI:     23.40 O2 Sat:      98 % on Room air Temp:     97.5 degrees F oral Pulse rate:   64 / minute Pulse rhythm:   regular Resp:     16 per minute BP sitting:   104 / 68  (right arm) Cuff size:   regular  Vitals Entered By: Glendell Docker CMA (January 19, 2010 9:03 AM)  O2 Flow:  Room air  Contraindications/Deferment of Procedures/Staging:    Test/Procedure: PAP Smear    Reason for deferment: patient declined  CC: Rm 3- 6 month follow up  Is Patient Diabetic? No Pain Assessment Patient in pain? no       Does patient need assistance? Functional Status Self care Ambulation Normal Comments no concerns   Primary Care Provider:  DThomos Lemons DO  CC:  Rm 3- 6 month follow up .  History of Present Illness:  Hypertension Follow-Up      This is a 77 year old woman who presents for Hypertension follow-up.  The patient denies lightheadedness and edema.  The patient denies the following associated symptoms: chest pain.  Compliance with medications (by patient report) has been near 100%.    she has hx of thyroid cancer.  she does not recall exact diagnosis not followed by endo  no neck symptoms   Preventive Screening-Counseling & Management  Alcohol-Tobacco     Smoking Status: never  Allergies: 1)  ! Sulfa  Past History:  Past Medical History: Current Problems:  HYPOTHYROIDISM (ICD-244.9) HYPERTENSION, BENIGN (ICD-401.1) THYROID CANCER (ICD-193)  ADENOCARCINOMA, BREAST (ICD-174.9)  DIVERTICULAR DISEASE (ICD-562.10) COLONIC POLYPS (ICD-211.3) HYPERLIPIDEMIA (ICD-272.4) sees gyn     Past Surgical History: left thyroidectomy breast lumpectomy       Family History: Family History of  "female cancer"    Social History: Occupation: Housewife Married  Never Smoked   Alcohol use-no spends time with 2 young  grandkids  Physical Exam  General:  alert, well-developed, and well-nourished.   Neck:  no masses. prev thyroidectomy scar.  no thyromegaly or thyroid nodules Lungs:  normal respiratory effort and normal breath sounds.   Heart:  normal rate, regular rhythm, and no gallop.   Extremities:  No lower extremity edema    Impression & Recommendations:  Problem # 1:  HYPERTENSION, BENIGN (ICD-401.1) well controlled.  Maintain current medication regimen.  Her updated medication list for this problem includes:    Maxzide-25 37.5-25 Mg Tabs (Triamterene-hctz) .Marland Kitchen... 1 tab  by mouth daily    Enalapril Maleate 20 Mg Tabs (Enalapril maleate) .Marland Kitchen... Take 1 tablet by mouth twice a day  Orders: T-Basic Metabolic Panel [phone])  BP today: 104/68 Prior BP: 104/70 (07/11/2009)  Labs Reviewed: K+: 4.1 (07/11/2009) Creat: : 0.87 (07/11/2009)   Chol: 185 (07/11/2009)   HDL: 55 (07/11/2009)   LDL: 108 (07/11/2009)   TG: 110 (07/11/2009)  Problem # 2:  HYPOTHYROIDISM (ICD-244.9) iatrogenic hypothyroidism.  monitor TFTs.  adjust dose accordingly  Her updated medication list for this problem includes:    Levothyroxine Sodium 88 Mcg Tabs (Levothyroxine sodium) ..... One by mouth once daily  Orders: T-TSH ([id number])  Problem # 3:  THYROID CANCER (ICD-193) obtain surveillance thyroid u/s Orders: Ultrasound (Ultrasound)  Complete Medication List: 1)  Levothyroxine Sodium 88 Mcg Tabs (Levothyroxine sodium) .Marland KitchenMarland KitchenMarland Kitchen  One by mouth once daily 2)  Alendronate Sodium 70 Mg Tabs (Alendronate sodium) .... Take 1 tab once weekly 3)  Maxzide-25 37.5-25 Mg Tabs (Triamterene-hctz) .Marland Kitchen.. 1 tab  by mouth daily 4)  Enalapril Maleate 20 Mg Tabs (Enalapril maleate) .... Take 1 tablet by mouth twice a day 5)  Simvastatin 40 Mg Tabs (Simvastatin) .... Take 1/2 tablet by mouth once a day 6)  Femara 2.5 Mg Tabs (Letrozole) .... Take 1 tablet by mouth once a day 7)  Bayer Aspirin Ec Low Dose 81 Mg Tbec (Aspirin) ....  Take 1 tablet by mouth once a day 8)  D 400 Caps (Vitamins a & d) .... 4 tablets daily 9)  Calcium Carbonate 600 Mg Tabs (Calcium carbonate) .... Take 1 tablet by mouth two times a day 10)  Zostavax 06237 Unt/0.58ml Solr (Zoster vaccine live) .... Administer vaccine x 1  Patient Instructions: 1)  Please schedule a follow-up appointment in 6 months. Prescriptions: ZOSTAVAX 62831 UNT/0.65ML SOLR (ZOSTER VACCINE LIVE) administer vaccine x 1  #1 x 0   Entered and Authorized by:   D. Thomos Lemons DO   Signed by:   D. Thomos Lemons DO on 01/19/2010   Method used:   Print then Give to Patient   RxID:   [id number] MAXZIDE-25 37.5-25 MG TABS (TRIAMTERENE-HCTZ) 1 tab  by mouth daily  #90 Tablet x 1   Entered and Authorized by:   D. Thomos Lemons DO   Signed by:   D. Thomos Lemons DO on 01/19/2010   Method used:   Electronically to        [facility] (retail)       [address]       [place], [place]  [address]       Ph: [phone] or [phone]       Fax: [phone]   RxID:   [id number] ENALAPRIL MALEATE 20 MG TABS (ENALAPRIL MALEATE) Take 1 tablet by mouth twice a day  #180 x 1   Entered and Authorized by:   D. Thomos Lemons DO   Signed by:   D. Thomos Lemons DO on 01/19/2010   Method used:   Electronically to        [facility] (retail)       [address]       [place], [place]  [address]       Ph: [phone] or [phone]       Fax: [phone]   RxID:   [id number]    Preventive Care Screening  Last Tetanus Booster:    Date:  12/16/2007    Results:  Historical    Current Allergies (reviewed today): ! SULFA

## 2010-08-01 NOTE — Progress Notes (Signed)
Summary: Alendronate Refill  Phone Note Refill Request Message from:  Patient on March 08, 2010 10:07 AM  Refills Requested: Medication #1:  ALENDRONATE SODIUM 70 MG TABS Take 1 tab once weekly   Dosage confirmed as above?Dosage Confirmed   Brand Name Necessary? No   Supply Requested: 1 month  Method Requested: Electronic Next Appointment Scheduled: 07-17-10 [address]is  Initial call taken by: Roselle Locus,  March 08, 2010 10:07 AM  Follow-up for Phone Call        Rx completed in Dr. Tiajuana Amass Follow-up by: Glendell Docker CMA,  March 08, 2010 2:38 PM    Prescriptions: ALENDRONATE SODIUM 70 MG TABS (ALENDRONATE SODIUM) Take 1 tab once weekly  #4 x 5   Entered by:   Glendell Docker CMA   Authorized by:   D. Thomos Lemons DO   Signed by:   Glendell Docker CMA on 03/08/2010   Method used:   Electronically to        [facility] (retail)       [address]       [place], [place]  [address]       Ph: [phone] or [phone]       Fax: [phone]   RxID:   [id number]

## 2010-08-03 NOTE — Letter (Signed)
   [place] [facility] [address] [place], [place]  [address]  [place] Phone: [phone]      July 24, 2010   [name] [address] [place], [place] [address]  RE:  LAB RESULTS  Dear  Ms. Schwarzkopf,  The following is an interpretation of your most recent lab tests.  Please take note of any instructions provided or changes to medications that have resulted from your lab work.  ELECTROLYTES:  Good - no changes needed  KIDNEY FUNCTION TESTS:  Good - no changes needed  LIVER FUNCTION TESTS:  Good - no changes needed  LIPID PANEL:  Good - no changes needed Triglyceride: 112   Cholesterol: 175   LDL: 107   HDL: 46   Chol/HDL%:  3.8 Ratio   Vitamin D level - 42  (normal)       Sincerely Yours,    Dr. Thomos Lemons  Appended Document:  Mailed.

## 2010-08-03 NOTE — Progress Notes (Signed)
Summary: please mail patient a copy of lab results  Phone Note Call from Patient   Caller: Patient Call For: Judy Norman  Summary of Call: please mail patient a copy of her lab results  Initial call taken by: Roselle Locus,  July 19, 2010 9:24 AM  Follow-up for Phone Call        copy of labs mailed to patient Follow-up by: Glendell Docker CMA,  July 19, 2010 9:39 AM

## 2010-08-09 NOTE — Assessment & Plan Note (Signed)
Summary: 6 month follow up/mhf   Vital Signs:  Patient profile:   77 year old female Height:      62 inches Weight:      130 pounds BMI:     23.86 O2 Sat:      99 % on Room air Temp:     97.4 degrees F oral Pulse rate:   61 / minute Resp:     16 per minute BP sitting:   100 / 70  (left arm) Cuff size:   regular  Vitals Entered By: Glendell Docker CMA (July 17, 2010 8:47 AM)  O2 Flow:  Room air  Contraindications/Deferment of Procedures/Staging:    Test/Procedure: PAP Smear    Reason for deferment: patient declined  CC: 6 month follow up Is Patient Diabetic? No Pain Assessment Patient in pain? no      Comments no concerns LMP - Character: menopausal Menarche (age onset years): 14   Menses interval (days): 28 Menstrual flow (days): 4 Last PAP Result Declined   Primary Care Provider:  DThomos Lemons DO  CC:  6 month follow up.  History of Present Illness:  Hypertension Follow-Up      This is a 77 year old woman who presents for Hypertension follow-up.  The patient denies lightheadedness and headaches.  The patient denies the following associated symptoms: chest pain.  Compliance with medications (by patient report) has been near 100%.  The patient reports that dietary compliance has been fair.    Preventive Screening-Counseling & Management  Alcohol-Tobacco     Smoking Status: never  Allergies: 1)  ! 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   Neck:  no masses. prev thyroidectomy scar.  no thyromegaly or thyroid nodules Lungs:  normal respiratory effort and normal breath sounds.   Heart:  normal rate, regular rhythm, and no gallop.   Extremities:  No lower extremity edema    Impression & Recommendations:  Problem # 1:  HYPERTENSION, BENIGN (ICD-401.1) Assessment Unchanged  Her updated medication list for this problem includes:    Maxzide-25 37.5-25 Mg Tabs (Triamterene-hctz) .Marland Kitchen... 1 tab  by mouth daily    Enalapril Maleate 20 Mg Tabs (Enalapril maleate) .Marland Kitchen... Take 1 tablet by mouth twice a day  Orders: T-Basic Metabolic Panel [id number])  BP today: 100/70 Prior BP: 120/70 (01/24/2010)  Labs Reviewed: K+: 4.2 (01/19/2010) Creat: : 0.97 (01/19/2010)   Chol: 185 (07/11/2009)   HDL: 55 (07/11/2009)   LDL: 108 (07/11/2009)   TG: 110 (07/11/2009)  Problem # 2:  HYPERLIPIDEMIA (ICD-272.4) Assessment: Unchanged  Her updated medication list for this problem includes:    Simvastatin 40 Mg Tabs (Simvastatin) .Marland Kitchen... Take 1/2 tablet by mouth once a day  Orders: T-Hepatic Function [id number]) T-Lipid Profile [phone])  Labs Reviewed: SGOT: 17 (07/11/2009)   SGPT: 14 (07/11/2009)   HDL:55 (07/11/2009), 50.70 (11/23/2008)  LDL:108 (07/11/2009), 98 (81/19/1478)  Chol:185 (07/11/2009), 168 (11/23/2008)  Trig:110 (07/11/2009), 96.0 (11/23/2008)  Problem # 3:  PREVENTIVE HEALTH CARE (ICD-V70.0) schedule PAP / Pelvic exam with NP  Problem # 4:  THYROID CANCER (ICD-193) Thyroid u/s 07/21011 IMPRESSION: No residual thyroid tissue present by ultrasound.  No abnormal masses or enlarged lymph nodes visualized.    Complete Medication List: 1)  Levothyroxine Sodium 75 Mcg Tabs (Levothyroxine sodium) .... One by mouth once daily 2)   Alendronate Sodium 70 Mg Tabs (Alendronate sodium) .... Take 1 tab once weekly 3)  Maxzide-25 37.5-25 Mg Tabs (Triamterene-hctz) .Marland Kitchen.. 1 tab  by mouth daily 4)  Enalapril Maleate 20 Mg Tabs (Enalapril maleate) .... Take 1 tablet by mouth twice a day 5)  Simvastatin 40 Mg Tabs (Simvastatin) .... Take 1/2 tablet by mouth once a day 6)  Femara 2.5 Mg Tabs (Letrozole) .... Take 1 tablet by mouth once a day 7)  Bayer Aspirin Ec Low Dose 81 Mg Tbec (Aspirin) .... Take 1 tablet by mouth once a day 8)  D 400 Caps (Vitamins a & d) .... 4 tablets daily 9)  Calcium Carbonate 600 Mg Tabs (Calcium carbonate) .... Take 1 tablet by mouth two times a day  Other Orders: T- * Misc. Laboratory test [phone])  Patient Instructions: 1)  Please schedule a follow-up appointment in 6 months. Prescriptions: ALENDRONATE SODIUM 70 MG TABS (ALENDRONATE SODIUM) Take 1 tab once weekly  #12 x 1   Entered and Authorized by:   D. Thomos Lemons DO   Signed by:   D. Thomos Lemons DO on 07/17/2010   Method used:   Print then Give to Patient   RxID:   [id number] SIMVASTATIN 40 MG TABS (SIMVASTATIN) Take 1/2 tablet by mouth once a day  #45 Tablet x 2   Entered and Authorized by:   D. Thomos Lemons DO   Signed by:   D. Thomos Lemons DO on 07/17/2010   Method used:   Print then Give to Patient   RxID:   [id number] ENALAPRIL MALEATE 20 MG TABS (ENALAPRIL MALEATE) Take 1 tablet by mouth twice a day  #180 x 1   Entered and Authorized by:   D. Thomos Lemons DO   Signed by:   D. Thomos Lemons DO on 07/17/2010   Method used:   Print then Give to Patient   RxID:   [id number] MAXZIDE-25 37.5-25 MG TABS (TRIAMTERENE-HCTZ) 1 tab  by mouth daily  #90 Tablet x 1   Entered and Authorized by:   D. Thomos Lemons DO   Signed by:   D. Thomos Lemons DO on 07/17/2010   Method used:   Print then Give to Patient   RxID:   [id number] LEVOTHYROXINE SODIUM 75 MCG TABS (LEVOTHYROXINE SODIUM) one by mouth once daily  #90 x 1   Entered and  Authorized by:   D. Thomos Lemons DO   Signed by:   D. Thomos Lemons DO on 07/17/2010   Method used:   Print then Give to Patient   RxID:   [id number]    Orders Added: 1)  T-Basic Metabolic Panel [[id number]] 2)  T-Hepatic Function [80076-22960] 3)  T-Lipid Profile [80061-22930] 4)  T- * Misc. Laboratory test [99999] 5)  Est. Patient Level III [22025]   Immunization History:  Influenza Immunization History:    Influenza:  historical (03/14/2010)  Zostavax History:    Zostavax # 1:  zostavax (02/07/2010)   Immunization History:  Influenza Immunization History:    Influenza:  Historical (03/14/2010)  Zostavax History:    Zostavax # 1:  Zostavax (02/07/2010)    Preventive Care Screening  Pap Smear:    Date:  07/17/2010    Results:  Declined  Last Flu Shot:    Date:  03/14/2010    Results:  Historical    Current Allergies (reviewed today): ! SULFA

## 2010-09-29 ENCOUNTER — Encounter: Payer: Self-pay | Admitting: Internal Medicine

## 2010-09-29 ENCOUNTER — Ambulatory Visit (INDEPENDENT_AMBULATORY_CARE_PROVIDER_SITE_OTHER): Payer: Medicare Other | Admitting: Internal Medicine

## 2010-09-29 VITALS — BP 112/72 | HR 71 | Temp 98.1°F | Resp 16 | Ht 62.0 in | Wt 129.0 lb

## 2010-09-29 DIAGNOSIS — M722 Plantar fascial fibromatosis: Secondary | ICD-10-CM

## 2010-09-29 NOTE — Patient Instructions (Signed)
Please call our office if your foot pain gets worse.

## 2010-09-29 NOTE — Assessment & Plan Note (Signed)
Reviewed stretching exercises Rest x 1-2 weeks Handout provided Pt to try shoe inserts and or new sneakers  If worsening symptoms, we discussed podiatry consult

## 2010-09-29 NOTE — Progress Notes (Signed)
  Subjective:    Patient ID: Judy Norman, female    DOB: 1932/04/28, 77 y.o.   MRN: [id number]  HPI  77 y/o female c/o new onset right heel pain.  Onset 5 days Severe pain.  Worse with wt bearing Pt has been walking regularly - approx 2 miles.  Usually wears sneakers. She hasn't changed sneakers in a while   Review of Systems     Objective:   Physical Exam  Constitutional: She appears well-developed and well-nourished.  Cardiovascular: Normal rate, regular rhythm and normal heart sounds.   Pulmonary/Chest: Effort normal and breath sounds normal.  Musculoskeletal:       Feet:       Tender medial aspect right heel. Ankle stable          Assessment & Plan:

## 2010-10-02 ENCOUNTER — Ambulatory Visit: Payer: Self-pay | Admitting: Family

## 2010-10-08 LAB — CBC
HCT: 37.8 % (ref 36.0–46.0)
Hemoglobin: 13.1 g/dL (ref 12.0–15.0)
MCHC: 34.8 g/dL (ref 30.0–36.0)
MCV: 93.1 fL (ref 78.0–100.0)
RDW: 14.3 % (ref 11.5–15.5)

## 2010-10-08 LAB — DIFFERENTIAL
Basophils Absolute: 0 10*3/uL (ref 0.0–0.1)
Basophils Relative: 0 % (ref 0–1)
Eosinophils Relative: 1 % (ref 0–5)
Lymphocytes Relative: 25 % (ref 12–46)
Monocytes Absolute: 0.5 10*3/uL (ref 0.1–1.0)

## 2010-10-08 LAB — BASIC METABOLIC PANEL
CO2: 27 mEq/L (ref 19–32)
Chloride: 103 mEq/L (ref 96–112)
Glucose, Bld: 128 mg/dL — ABNORMAL HIGH (ref 70–99)
Potassium: 3.3 mEq/L — ABNORMAL LOW (ref 3.5–5.1)
Sodium: 137 mEq/L (ref 135–145)

## 2010-10-08 LAB — URINALYSIS, ROUTINE W REFLEX MICROSCOPIC
Glucose, UA: NEGATIVE mg/dL
Hgb urine dipstick: NEGATIVE
Protein, ur: NEGATIVE mg/dL

## 2010-10-09 LAB — HM COLONOSCOPY

## 2010-10-19 ENCOUNTER — Encounter: Payer: Self-pay | Admitting: Internal Medicine

## 2010-11-01 ENCOUNTER — Other Ambulatory Visit: Payer: Self-pay | Admitting: Family

## 2010-11-01 ENCOUNTER — Encounter (HOSPITAL_BASED_OUTPATIENT_CLINIC_OR_DEPARTMENT_OTHER): Payer: Medicare Other | Admitting: Hematology & Oncology

## 2010-11-01 ENCOUNTER — Other Ambulatory Visit: Payer: Self-pay | Admitting: Hematology & Oncology

## 2010-11-01 DIAGNOSIS — E559 Vitamin D deficiency, unspecified: Secondary | ICD-10-CM

## 2010-11-01 DIAGNOSIS — Z170 Estrogen receptor positive status [ER+]: Secondary | ICD-10-CM

## 2010-11-01 DIAGNOSIS — C50919 Malignant neoplasm of unspecified site of unspecified female breast: Secondary | ICD-10-CM

## 2010-11-01 DIAGNOSIS — N63 Unspecified lump in unspecified breast: Secondary | ICD-10-CM

## 2010-11-01 DIAGNOSIS — C50319 Malignant neoplasm of lower-inner quadrant of unspecified female breast: Secondary | ICD-10-CM

## 2010-11-01 DIAGNOSIS — Z853 Personal history of malignant neoplasm of breast: Secondary | ICD-10-CM

## 2010-11-02 ENCOUNTER — Other Ambulatory Visit: Payer: Self-pay | Admitting: Internal Medicine

## 2010-11-02 LAB — VITAMIN D 25 HYDROXY (VIT D DEFICIENCY, FRACTURES): Vit D, 25-Hydroxy: 39 ng/mL (ref 30–89)

## 2010-11-07 ENCOUNTER — Telehealth: Payer: Self-pay | Admitting: *Deleted

## 2010-11-07 ENCOUNTER — Other Ambulatory Visit (INDEPENDENT_AMBULATORY_CARE_PROVIDER_SITE_OTHER): Payer: Medicare Other | Admitting: Internal Medicine

## 2010-11-07 DIAGNOSIS — E039 Hypothyroidism, unspecified: Secondary | ICD-10-CM

## 2010-11-07 MED ORDER — LEVOTHYROXINE SODIUM 100 MCG PO TABS: 75.0000 ug | ORAL_TABLET | Freq: Every day | ORAL | Status: DC

## 2010-11-07 NOTE — Assessment & Plan Note (Signed)
Lab Results  Component Value Date   TSH 8.751* 11/02/2010   Pt reports not missing any doses.  Increase levothyroxine to 100 mcg once daily. Repeat TSH in 2 months

## 2010-11-07 NOTE — Telephone Encounter (Signed)
Message copied by Mervin Kung on Tue Nov 07, 2010  3:54 PM ------      Message from: Thomos Lemons DR.      Created: Tue Nov 07, 2010  1:22 PM       I increase dose to 100 mcg      Please make sure pt not taking with vitamins or antacids.            Schedule repeat TSH in 2 months

## 2010-11-07 NOTE — Telephone Encounter (Signed)
Left detailed message on home number re: New Rx with increased dose on Levothyroxine and to call me back re: future lab.

## 2010-11-07 NOTE — Telephone Encounter (Signed)
Message copied by Mervin Kung on Tue Nov 07, 2010  1:27 PM ------      Message from: Thomos Lemons DR.      Created: Tue Nov 07, 2010  1:22 PM       I increase dose to 100 mcg      Please make sure pt not taking with vitamins or antacids.            Schedule repeat TSH in 2 months

## 2010-11-07 NOTE — Telephone Encounter (Signed)
Pt notified and will return to the lab the 1st week of July for repeat TSH. Order has been entered and forwarded to the lab.
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
## 2011-01-01 ENCOUNTER — Other Ambulatory Visit: Payer: Self-pay | Admitting: Internal Medicine

## 2011-01-01 ENCOUNTER — Ambulatory Visit: Payer: Self-pay | Admitting: Internal Medicine

## 2011-01-02 ENCOUNTER — Telehealth: Payer: Self-pay | Admitting: Internal Medicine

## 2011-01-02 LAB — TSH: TSH: 0.196 u[IU]/mL — ABNORMAL LOW (ref 0.350–4.500)

## 2011-01-02 NOTE — Telephone Encounter (Signed)
Call placed to patient [phone], she states she was not requesting refill, she has refills at the pharmacy. She wanted to know if she should remain at current dose of 100 mcg. Patient was informed that I did not know if she should continue with current dose, not sure if labs have been reviewed. She states that she has a follow up appointment on Monday and has obtained enough medication from the pharmacy. She will follow up on test results and dose changes at her office visit.

## 2011-01-02 NOTE — Telephone Encounter (Signed)
Pt states that she will be out of levothyroxine on Friday. Pt has a follow up appt on 01-08-11. Pt states that she does not want to get her meds refilled until after that appt in case the mg changes but does not want to go w/o meds for those days.

## 2011-01-08 ENCOUNTER — Ambulatory Visit (INDEPENDENT_AMBULATORY_CARE_PROVIDER_SITE_OTHER): Payer: Medicare Other | Admitting: Family

## 2011-01-08 ENCOUNTER — Other Ambulatory Visit: Payer: Self-pay | Admitting: Family

## 2011-01-08 ENCOUNTER — Encounter: Payer: Self-pay | Admitting: Family

## 2011-01-08 DIAGNOSIS — I10 Essential (primary) hypertension: Secondary | ICD-10-CM

## 2011-01-08 DIAGNOSIS — E785 Hyperlipidemia, unspecified: Secondary | ICD-10-CM

## 2011-01-08 DIAGNOSIS — E039 Hypothyroidism, unspecified: Secondary | ICD-10-CM

## 2011-01-08 LAB — HEPATIC FUNCTION PANEL
Alkaline Phosphatase: 54 U/L (ref 39–117)
Indirect Bilirubin: 0.5 mg/dL (ref 0.0–0.9)
Total Bilirubin: 0.6 mg/dL (ref 0.3–1.2)

## 2011-01-08 LAB — BASIC METABOLIC PANEL
CO2: 30 mEq/L (ref 19–32)
Calcium: 10.1 mg/dL (ref 8.4–10.5)
Sodium: 138 mEq/L (ref 135–145)

## 2011-01-08 MED ORDER — LEVOTHYROXINE SODIUM 88 MCG PO TABS: 88.0000 ug | ORAL_TABLET | Freq: Every day | ORAL | Status: DC

## 2011-01-08 MED ORDER — SIMVASTATIN 40 MG PO TABS: 40.0000 mg | ORAL_TABLET | Freq: Every day | ORAL | Status: DC

## 2011-01-08 NOTE — Assessment & Plan Note (Signed)
Lipids stable in January.  Will repeat the LFT's.

## 2011-01-08 NOTE — Assessment & Plan Note (Addendum)
BP Readings from Last 3 Encounters:  01/08/11 110/62  09/29/10 112/72  07/17/10 100/70  BP is stable on enalapril and maxide. Continue same. Check BMET.

## 2011-01-08 NOTE — Progress Notes (Signed)
Subjective:    Patient ID: Judy Norman, female    DOB: [birth year], 77 y.o.   MRN: [id number]  HPI  Ms.  Norman is a 77 yr old female who presents today for routine follow up.  Hypothyroidism- reports + compliance with levothyroxine.  Notes that she is about to run out of the levothyroxine.   Hyperlipidemia- tolerating simvastatin without difficulty, denies muscle pain. + compliance  HTN- on enalapril  Denies LE edema.    Review of Systems See HPI  Past Medical History  Diagnosis Date  . Hypothyroidism   . Hypertension     benign  . Cancer     thyroid  . Adenocarcinoma of breast   . Diverticular disease   . Colon polyps   . Hyperlipidemia     History   Social History  . Marital Status: Widowed    Spouse Name: N/A    Number of Children: N/A  . Years of Education: N/A   Occupational History  . housewife    Social History Main Topics  . Smoking status: Never Smoker   . Smokeless tobacco: Not on file  . Alcohol Use: No  . Drug Use: Not on file  . Sexually Active: Not on file   Other Topics Concern  . Not on file   Social History Narrative  . No narrative on file    Past Surgical History  Procedure Date  . Thyroidectomy   . Breast lumpectomy     No family history on file.  Allergies  Allergen Reactions  . Sulfonamide Derivatives     Current Outpatient Prescriptions on File Prior to Visit  Medication Sig Dispense Refill  . alendronate (FOSAMAX) 70 MG tablet Take 70 mg by mouth once a week. Take in the morning with a full glass of water, on an empty stomach, and do not take anything else by mouth or lie down for the next 30 min.       Marland Kitchen aspirin 81 MG EC tablet Take 81 mg by mouth daily.        . calcium carbonate (OS-CAL) 600 MG TABS Take 600 mg by mouth 2 (two) times daily.        . enalapril (VASOTEC) 20 MG tablet Take 20 mg by mouth 2 (two) times daily.        Marland Kitchen letrozole (FEMARA) 2.5 MG tablet Take 2.5 mg by mouth daily.        Marland Kitchen  triamterene-hydrochlorothiazide (MAXZIDE-25) 37.5-25 MG per tablet Take 1 tablet by mouth daily.        . Vitamins A & D (VITAMIN A & D) 10000-400 UNIT TABS Take 4 tablets by mouth daily.        Marland Kitchen DISCONTD: levothyroxine (SYNTHROID, LEVOTHROID) 100 MCG tablet Take 1 tablet (100 mcg total) by mouth daily.  30 tablet  3  . DISCONTD: simvastatin (ZOCOR) 40 MG tablet Take 1/2 tablet once a day.         BP 110/62  Pulse 72  Resp 16  Ht 5\' 2"  (1.575 m)  Wt 121 lb 1.3 oz (54.922 kg)  BMI 22.15 kg/m2       Objective:   Physical Exam  Constitutional: She appears well-developed and well-nourished.  Neck: Normal range of motion. Neck supple. No thyromegaly present.  Cardiovascular: Normal rate and regular rhythm.   Pulmonary/Chest: Effort normal and breath sounds normal.  Psychiatric: She has a normal mood and affect. Her behavior is normal. Judgment and thought content normal.  Assessment & Plan:

## 2011-01-08 NOTE — Assessment & Plan Note (Signed)
TSH depressed today at 0.196.  Will decrease her synthroid form to .  Pt is instructed to return to the lab in 6 weeks for follow up TSH.

## 2011-01-08 NOTE — Patient Instructions (Signed)
Please complete your blood work on the first floor today. Follow up in 6 weeks for a thyroid test.  Follow up in 6 months, sooner if problems or concerns.

## 2011-01-09 ENCOUNTER — Telehealth: Payer: Self-pay | Admitting: *Deleted

## 2011-01-09 NOTE — Telephone Encounter (Signed)
Pt called requesting lab results from yesterday. Please advise.

## 2011-01-09 NOTE — Telephone Encounter (Signed)
Returned phone call- reviewed blood work results with pt from yesterday.

## 2011-01-16 ENCOUNTER — Telehealth: Payer: Self-pay | Admitting: *Deleted

## 2011-01-16 DIAGNOSIS — E039 Hypothyroidism, unspecified: Secondary | ICD-10-CM

## 2011-01-16 NOTE — Telephone Encounter (Signed)
Message copied by Kathi Simpers on Tue Jan 16, 2011  2:59 PM ------      Message from: O'SULLIVAN, MELISSA      Created: Mon Jan 08, 2011 11:08 AM       Pls send TSH order for 6 weeks to the lab

## 2011-01-16 NOTE — Telephone Encounter (Signed)
Order entered and forwarded to the lab for the week of 02/19/11.

## 2011-02-13 ENCOUNTER — Telehealth: Payer: Self-pay | Admitting: Family

## 2011-02-13 DIAGNOSIS — E039 Hypothyroidism, unspecified: Secondary | ICD-10-CM

## 2011-02-13 MED ORDER — LEVOTHYROXINE SODIUM 75 MCG PO TABS: 75.0000 ug | ORAL_TABLET | Freq: Every day | ORAL | Status: DC

## 2011-02-13 NOTE — Telephone Encounter (Signed)
Pt informed and labs ordered 

## 2011-02-13 NOTE — Telephone Encounter (Addendum)
Please call pt and let her know that her thyroid function is improving, but not quite where I would like it to be.  I would like to decrease her synthroid from 88 to 75 mcg and have her return in 6 weeks for TSH (244.9) please. Rx has been sent to [facility].

## 2011-02-27 ENCOUNTER — Telehealth: Payer: Self-pay | Admitting: Family

## 2011-02-27 NOTE — Telephone Encounter (Signed)
Refill- maxzide-25 tab myla. Take one tablet daily. Qty 90. Last fill 5.29.12

## 2011-02-28 MED ORDER — TRIAMTERENE-HCTZ 37.5-25 MG PO TABS: 1.0000 | ORAL_TABLET | Freq: Every day | ORAL | Status: DC

## 2011-02-28 NOTE — Telephone Encounter (Signed)
Maxzide 37.5-25mg  1 tablet daily #90 x 1 refill sent to [facility].

## 2011-03-26 ENCOUNTER — Telehealth: Payer: Self-pay | Admitting: Internal Medicine

## 2011-03-26 MED ORDER — ALENDRONATE SODIUM 70 MG PO TABS: 70.0000 mg | ORAL_TABLET | ORAL | Status: DC

## 2011-03-26 NOTE — Telephone Encounter (Signed)
Refill- fosamax 70mg  tab [facility]. Take one tablet once weekly. Qty 12 last fill 7.9.12

## 2011-03-26 NOTE — Telephone Encounter (Signed)
Refill sent.

## 2011-03-27 ENCOUNTER — Telehealth: Payer: Self-pay | Admitting: Family

## 2011-03-27 DIAGNOSIS — E039 Hypothyroidism, unspecified: Secondary | ICD-10-CM

## 2011-03-27 MED ORDER — LEVOTHYROXINE SODIUM 88 MCG PO TABS: 88.0000 ug | ORAL_TABLET | Freq: Every day | ORAL | Status: DC

## 2011-03-27 NOTE — Telephone Encounter (Signed)
Pt presented to the office and was notified per instructions below. Lab order has been entered and forwarded to the lab for the week of 04/30/11.

## 2011-03-27 NOTE — Telephone Encounter (Signed)
Pt confirmed that she has been taking Levothyroxine daily, no skipped doses.

## 2011-03-27 NOTE — Telephone Encounter (Signed)
Left message on machine to return my call. 

## 2011-03-27 NOTE — Telephone Encounter (Signed)
Please confirm with pt that she is taking the Levothyroxine .  Her lab work shows that we need for her to return to the 88 mcg dose.  Rx sent to pharmacy. She should return in 6 weeks for TSH please.

## 2011-04-16 ENCOUNTER — Other Ambulatory Visit: Payer: Medicare Other

## 2011-04-17 ENCOUNTER — Ambulatory Visit: Payer: Medicare Other | Admitting: Internal Medicine

## 2011-04-17 ENCOUNTER — Other Ambulatory Visit: Payer: Medicare Other

## 2011-04-23 ENCOUNTER — Ambulatory Visit
Admission: RE | Admit: 2011-04-23 | Discharge: 2011-04-23 | Disposition: A | Discharge status: Home / Self Care | Payer: Medicare Other | Source: Ambulatory Visit | Attending: Hematology & Oncology | Admitting: Hematology & Oncology

## 2011-04-23 DIAGNOSIS — Z853 Personal history of malignant neoplasm of breast: Secondary | ICD-10-CM

## 2011-04-23 DIAGNOSIS — C50919 Malignant neoplasm of unspecified site of unspecified female breast: Secondary | ICD-10-CM

## 2011-05-01 ENCOUNTER — Encounter: Payer: Self-pay | Admitting: Family

## 2011-05-03 ENCOUNTER — Telehealth: Payer: Self-pay | Admitting: Internal Medicine

## 2011-05-03 MED ORDER — LEVOTHYROXINE SODIUM 88 MCG PO TABS: 88.0000 ug | ORAL_TABLET | Freq: Every day | ORAL | Status: DC

## 2011-05-03 NOTE — Telephone Encounter (Signed)
Please advise if levothyroxine dose should remain the same?

## 2011-05-03 NOTE — Telephone Encounter (Signed)
Pt should continue current dose. Ok to send #30 with 5 refills pls.

## 2011-05-03 NOTE — Telephone Encounter (Signed)
Pt notified and rx sent to [facility].

## 2011-05-03 NOTE — Telephone Encounter (Signed)
SHE HAD HER LAB WORK DRAWN ON Monday AND SHE WOULD LIKE THE RESULTS.  SHE IS ON 88MG  OF LEVOTHROXIN  SHE IS ABOUT TO RUN OUT.  SHOULD SHE STAY ON 88MG  AND REFILL THE RX SHE HAS NOW OR DOES SHE NEED A DIFFERENT DOSE.

## 2011-06-08 ENCOUNTER — Telehealth: Payer: Self-pay | Admitting: *Deleted

## 2011-06-08 NOTE — Telephone Encounter (Signed)
Pt aware of 1-7 time change

## 2011-07-09 ENCOUNTER — Ambulatory Visit: Payer: Medicare Other | Admitting: Family

## 2011-07-09 ENCOUNTER — Other Ambulatory Visit: Payer: Medicare Other | Admitting: Lab

## 2011-07-09 ENCOUNTER — Ambulatory Visit (HOSPITAL_BASED_OUTPATIENT_CLINIC_OR_DEPARTMENT_OTHER): Payer: Medicare Other | Admitting: Hematology & Oncology

## 2011-07-09 DIAGNOSIS — C50919 Malignant neoplasm of unspecified site of unspecified female breast: Secondary | ICD-10-CM

## 2011-07-09 DIAGNOSIS — M810 Age-related osteoporosis without current pathological fracture: Secondary | ICD-10-CM

## 2011-07-09 DIAGNOSIS — M4000 Postural kyphosis, site unspecified: Secondary | ICD-10-CM

## 2011-07-09 DIAGNOSIS — Z170 Estrogen receptor positive status [ER+]: Secondary | ICD-10-CM

## 2011-07-09 DIAGNOSIS — M899 Disorder of bone, unspecified: Secondary | ICD-10-CM

## 2011-07-09 NOTE — Progress Notes (Signed)
CC:   Judy Norman. Judy Pais, DO  DIAGNOSIS:  Stage I (T1 N1 M0) ductal carcinoma of the left breast.  CURRENT THERAPY:  Observation.  INTERIM HISTORY:  Judy Norman comes in for followup.  She is doing well. She has had no complaints since we last saw her.  She had a mammogram done in October of this year.  The mammogram looked great with no evidence of suspicious calcifications.  She did have a bone density test done.  This did show some low bone mass.  She has, I think, borderline osteopenia and osteoporosis.  She does take calcium and vitamin D.  She has had no complaints of bony issues.  She had a colostomy earlier this year by Judy Norman.  This was done in April.  She has had no problems with nausea or vomiting.  There has been no change in bowel or bladder habits.  She has had no change in her medications.  PHYSICAL EXAM:  General:  This is a petite white female in no obvious distress.  Vital Signs:  Temperature 97, pulse 66, respiratory rate 12, blood pressure 146/81.  Weight is 119.  Head/Neck:  Exam shows a normocephalic, atraumatic skull.  There are no ocular or oral lesions. There are no palpable cervical or supraclavicular lymph nodes.  Lungs: Clear bilaterally.  Cardiac:  Regular rate and rhythm with a normal S1, S2.  There are no murmurs or rubs, or bruits.  Abdomen:  Soft with good bowel sounds.  There is no palpable abdominal mass.  There is no fluid wave.  There is no palpable hepatosplenomegaly.  Extremities:  No clubbing, cyanosis or edema.  Breasts:  Exam shows the right breast with no masses, edema or erythema.  No right axillary adenopathy. The left breast shows some slight contraction.  She has a lumpectomy at the 6 o'clock position.  There is some slight erythema on the left breast from past radiation.  She has no obvious mass in the left breast.  There is no left axillary adenopathy.  Back:  Exam does show some kyphosis. Skin:  No rashes, ecchymosis or  petechiae.  LABORATORY STUDIES:  Not done this visit.  IMPRESSION:  [name] is a 78 year old white female with a history of stage I infiltrating ductal carcinoma of the left breast.  She was diagnosed in November of 2008.  She was placed on Femara.  Her tumor was ER POSITIVE.  The tumor also was HER-2/NEU POSITIVE.  She was given Herceptin also.  For now, I do not see any evidence of recurrent disease.  We will plan to get her back in 6 months.  She will have 5 years of Femara at the end of 2013.  We can consider getting her off Femara at that time.    ______________________________ Josph Macho, M.D. PRE/MEDQ  D:  07/09/2011  T:  07/09/2011  Job:  [id number]

## 2011-07-09 NOTE — Progress Notes (Signed)
This office note has been dictated.

## 2011-07-16 ENCOUNTER — Telehealth: Payer: Self-pay | Admitting: Internal Medicine

## 2011-07-16 ENCOUNTER — Ambulatory Visit: Payer: Medicare Other | Admitting: Internal Medicine

## 2011-07-16 ENCOUNTER — Other Ambulatory Visit: Payer: Self-pay | Admitting: Internal Medicine

## 2011-07-16 ENCOUNTER — Ambulatory Visit (INDEPENDENT_AMBULATORY_CARE_PROVIDER_SITE_OTHER): Payer: Medicare Other | Admitting: Internal Medicine

## 2011-07-16 ENCOUNTER — Encounter: Payer: Self-pay | Admitting: Internal Medicine

## 2011-07-16 DIAGNOSIS — E039 Hypothyroidism, unspecified: Secondary | ICD-10-CM

## 2011-07-16 DIAGNOSIS — Z79899 Other long term (current) drug therapy: Secondary | ICD-10-CM

## 2011-07-16 DIAGNOSIS — E785 Hyperlipidemia, unspecified: Secondary | ICD-10-CM

## 2011-07-16 DIAGNOSIS — I10 Essential (primary) hypertension: Secondary | ICD-10-CM

## 2011-07-16 LAB — T4, FREE: Free T4: 1.47 ng/dL (ref 0.80–1.80)

## 2011-07-16 LAB — BASIC METABOLIC PANEL
BUN: 16 mg/dL (ref 6–23)
Glucose, Bld: 86 mg/dL (ref 70–99)
Potassium: 4.4 mEq/L (ref 3.5–5.3)

## 2011-07-16 LAB — LIPID PANEL
Cholesterol: 173 mg/dL (ref 0–200)
LDL Cholesterol: 102 mg/dL — ABNORMAL HIGH (ref 0–99)
VLDL: 19 mg/dL (ref 0–40)

## 2011-07-16 LAB — HEPATIC FUNCTION PANEL
Bilirubin, Direct: 0.1 mg/dL (ref 0.0–0.3)
Indirect Bilirubin: 0.4 mg/dL (ref 0.0–0.9)

## 2011-07-16 NOTE — Progress Notes (Signed)
  Subjective:    Patient ID: Judy Norman, female    DOB: February 18, 1932, 78 y.o.   MRN: [id number]  HPI Pt presents to clinic for followup of multiple medical problems.  Past Medical History  Diagnosis Date  . Hypothyroidism   . Hypertension     benign  . Cancer     thyroid  . Adenocarcinoma of breast   . Diverticular disease   . Colon polyps   . Hyperlipidemia    Past Surgical History  Procedure Date  . Thyroidectomy   . Breast lumpectomy     reports that she has never smoked. She has never used smokeless tobacco. She reports that she does not drink alcohol. Her drug history not on file. family history is not on file. Allergies  Allergen Reactions  . Sulfonamide Derivatives       Review of Systems see hpi     Objective:   Physical Exam  Nursing note and vitals reviewed. Constitutional: She appears well-developed and well-nourished. No distress.  HENT:  Head: Normocephalic and atraumatic.  Right Ear: External ear normal.  Left Ear: External ear normal.  Mouth/Throat: Oropharynx is clear and moist.  Eyes: Conjunctivae are normal. Right eye exhibits no discharge. Left eye exhibits no discharge. No scleral icterus.  Neck: Neck supple. No thyromegaly present.  Cardiovascular: Normal rate, regular rhythm and normal heart sounds.   Pulmonary/Chest: Breath sounds normal. No respiratory distress. She has no wheezes. She has no rales.  Lymphadenopathy:    She has no cervical adenopathy.  Neurological: She is alert.  Skin: Skin is warm and dry. She is not diaphoretic.  Psychiatric: She has a normal mood and affect.          Assessment & Plan:

## 2011-07-16 NOTE — Assessment & Plan Note (Signed)
Obtain tsh/ft4 

## 2011-07-16 NOTE — Assessment & Plan Note (Signed)
Obtain lipid/lft. 

## 2011-07-16 NOTE — Telephone Encounter (Signed)
Lab orders entered for July 2013. 

## 2011-07-16 NOTE — Patient Instructions (Signed)
Please schedule cbc 401.9, chem7 v58.69, tsh-hypothyroidism, lipid/lft 272.4 prior to next visit

## 2011-08-13 ENCOUNTER — Telehealth: Payer: Self-pay | Admitting: Internal Medicine

## 2011-08-13 MED ORDER — ENALAPRIL MALEATE 20 MG PO TABS: 20.0000 mg | ORAL_TABLET | Freq: Two times a day (BID) | ORAL | Status: DC

## 2011-08-13 NOTE — Telephone Encounter (Signed)
Rx refill sent to pharmacy. 

## 2011-08-27 ENCOUNTER — Telehealth: Payer: Self-pay | Admitting: Family

## 2011-08-27 MED ORDER — TRIAMTERENE-HCTZ 37.5-25 MG PO TABS: 1.0000 | ORAL_TABLET | Freq: Every day | ORAL | Status: DC

## 2011-08-27 MED ORDER — ALENDRONATE SODIUM 70 MG PO TABS: 70.0000 mg | ORAL_TABLET | ORAL | Status: DC

## 2011-08-27 NOTE — Telephone Encounter (Signed)
Refills sent to pharmacy for alendronate and triam/hctz.

## 2011-09-03 ENCOUNTER — Telehealth: Payer: Self-pay | Admitting: *Deleted

## 2011-09-03 NOTE — Telephone Encounter (Signed)
Patient presented as walk in to office,with a request to speak with me. 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 She was given a blood test by Dr Vonna Kotyk and was found to have mildly low -12. She was advised to return to her primary care physician for follow up. She is scheduled July 15th, with Dr Rodena Medin . She would like to know if she should wait for her appointment in July to have her B-12 checked or should she return for an earlier appointment.She stated that she has purchased  Vitamin B-12 1000 to take in the meantime.

## 2011-09-04 ENCOUNTER — Ambulatory Visit (INDEPENDENT_AMBULATORY_CARE_PROVIDER_SITE_OTHER): Payer: Medicare Other | Admitting: Internal Medicine

## 2011-09-04 ENCOUNTER — Encounter: Payer: Self-pay | Admitting: Internal Medicine

## 2011-09-04 DIAGNOSIS — I10 Essential (primary) hypertension: Secondary | ICD-10-CM

## 2011-09-04 DIAGNOSIS — E538 Deficiency of other specified B group vitamins: Secondary | ICD-10-CM

## 2011-09-04 MED ORDER — ENALAPRIL MALEATE 20 MG PO TABS: 20.0000 mg | ORAL_TABLET | Freq: Two times a day (BID) | ORAL | Status: DC

## 2011-09-04 NOTE — Telephone Encounter (Signed)
Call placed to patient regarding appointment scheduled. She stated it was in regard to phone message from yesterday. She stated she needed to ask additional information regarding cholesterol and vitamin d. She states she will keep the scheduled appointment to have Dr Rodena Medin address her concerns, instead of waiting for a return phone call.

## 2011-09-04 NOTE — Patient Instructions (Signed)
We will add a vitamin b12 level to your next visit's labs (dx-b12 deficiency)

## 2011-09-08 DIAGNOSIS — E538 Deficiency of other specified B group vitamins: Secondary | ICD-10-CM | POA: Insufficient documentation

## 2011-09-08 NOTE — Assessment & Plan Note (Signed)
Begin b12 po qd. Obtain b12 level prior to next visit

## 2011-09-08 NOTE — Assessment & Plan Note (Signed)
Normotensive and stable. Continue current regimen. Monitor bp as outpt and followup in clinic as scheduled. rf vasotec

## 2011-09-08 NOTE — Progress Notes (Signed)
  Subjective:    Patient ID: Judy Norman, female    DOB: 05-26-1932, 78 y.o.   MRN: [id number]  HPI Pt presents to clinic for evaluation of b12 deficiency. Recently seen by optho and underwent lab testing. Was told b12 was low normal to low. No fatigue or paresthesias. Outside labs not available for review. H/o HTN and bp reviewed as normotensive. Requests rf of vasotec. No other complaints.  Past Medical History  Diagnosis Date  . Hypothyroidism   . Hypertension     benign  . Cancer     thyroid  . Adenocarcinoma of breast   . Diverticular disease   . Colon polyps   . Hyperlipidemia    Past Surgical History  Procedure Date  . Thyroidectomy   . Breast lumpectomy     reports that she has never smoked. She has never used smokeless tobacco. She reports that she does not drink alcohol. Her drug history not on file. family history is not on file. Allergies  Allergen Reactions  . Sulfonamide Derivatives      Review of Systems see hpi     Objective:   Physical Exam  Nursing note and vitals reviewed. Constitutional: She appears well-developed and well-nourished. No distress.  HENT:  Head: Normocephalic and atraumatic.  Neurological: She is alert.  Skin: She is not diaphoretic.  Psychiatric: She has a normal mood and affect.          Assessment & Plan:

## 2011-10-11 ENCOUNTER — Other Ambulatory Visit: Payer: Self-pay | Admitting: *Deleted

## 2011-10-11 DIAGNOSIS — C50919 Malignant neoplasm of unspecified site of unspecified female breast: Secondary | ICD-10-CM

## 2011-10-11 MED ORDER — LETROZOLE 2.5 MG PO TABS: 2.5000 mg | ORAL_TABLET | Freq: Every day | ORAL | Status: DC

## 2011-10-11 NOTE — Telephone Encounter (Signed)
Received refill request from [facility] for Femara 2.5 mg. This is a chronic drug for her. Refilled with enough to get her through the end of the year when she will most likely complete therapy.

## 2011-10-29 ENCOUNTER — Telehealth: Payer: Self-pay | Admitting: Internal Medicine

## 2011-10-29 MED ORDER — LEVOTHYROXINE SODIUM 88 MCG PO TABS: 88.0000 ug | ORAL_TABLET | Freq: Every day | ORAL | Status: DC

## 2011-10-29 NOTE — Telephone Encounter (Signed)
Rx refill sent to pharmacy. 

## 2011-10-29 NOTE — Telephone Encounter (Signed)
Refill- levothyroxine sodium oral tablet . Take one tablet by mouth one time daily. Qty 30 last fill 4.5.13

## 2011-12-20 ENCOUNTER — Other Ambulatory Visit (HOSPITAL_BASED_OUTPATIENT_CLINIC_OR_DEPARTMENT_OTHER): Payer: Medicare Other | Admitting: Lab

## 2011-12-20 ENCOUNTER — Ambulatory Visit (HOSPITAL_BASED_OUTPATIENT_CLINIC_OR_DEPARTMENT_OTHER): Payer: Medicare Other | Admitting: Hematology & Oncology

## 2011-12-20 VITALS — BP 114/67 | HR 63 | Temp 96.9°F | Ht 62.0 in | Wt 118.0 lb

## 2011-12-20 DIAGNOSIS — C50919 Malignant neoplasm of unspecified site of unspecified female breast: Secondary | ICD-10-CM

## 2011-12-20 DIAGNOSIS — M810 Age-related osteoporosis without current pathological fracture: Secondary | ICD-10-CM

## 2011-12-20 DIAGNOSIS — Z170 Estrogen receptor positive status [ER+]: Secondary | ICD-10-CM

## 2011-12-20 LAB — CBC WITH DIFFERENTIAL (CANCER CENTER ONLY)
BASO#: 0 10*3/uL (ref 0.0–0.2)
EOS%: 0.9 % (ref 0.0–7.0)
HGB: 15 g/dL (ref 11.6–15.9)
LYMPH#: 1.2 10*3/uL (ref 0.9–3.3)
MCHC: 34.9 g/dL (ref 32.0–36.0)
NEUT#: 5.7 10*3/uL (ref 1.5–6.5)
RBC: 4.58 10*6/uL (ref 3.70–5.32)
WBC: 7.6 10*3/uL (ref 3.9–10.0)

## 2011-12-20 NOTE — Progress Notes (Signed)
This office note has been dictated.

## 2011-12-21 LAB — COMPREHENSIVE METABOLIC PANEL
ALT: 13 U/L (ref 0–35)
AST: 16 U/L (ref 0–37)
Albumin: 4.1 g/dL (ref 3.5–5.2)
BUN: 15 mg/dL (ref 6–23)
Calcium: 10.1 mg/dL (ref 8.4–10.5)
Chloride: 98 mEq/L (ref 96–112)
Potassium: 4.2 mEq/L (ref 3.5–5.3)

## 2011-12-21 NOTE — Progress Notes (Signed)
CC:   Marguarite Arbour, MD  DIAGNOSIS:  Stage I (T1 N0 M0) ductal carcinoma of the left breast.  CURRENT THERAPY:  Femara 2.5 mg p.o. daily.  Patient to finish in November 2013.  INTERIM HISTORY:  Judy Norman comes in for her followup.  We see her every 6 months.  She is doing well.  She has had no complaints since we saw her.  She has had no nausea or vomiting.  There has been no bony pain.  There has been no fatigue or weakness.  She has had no cough. There has been no change in bowel or bladder habits.  PHYSICAL EXAMINATION:  General:  This is a well developed, well- nourished white female in no obvious distress.  Vital Signs: Temperature 96.9, pulse 63, respiratory rate 20, blood pressure 114/67, weight is 118.  Head and Neck Exam:  Shows a normocephalic, atraumatic skull.  There are no ocular or oral lesions.  There are no palpable cervical or supraclavicular lymph nodes.  Lungs:  Clear to percussion and auscultation bilaterally.  Cardiac Exam:  Regular rate and rhythm with a normal S1 and S2.  There are no murmurs, rubs, or bruits.  Breast Exam:  Shows right breast with no masses, edema or erythema.  There is no right axillary adenopathy.  Left breast shows a well-healed lumpectomy.  The lumpectomy is at the 6 o'clock position.  I see no tenderness or distinct mass in the left breast.  There is no left axillary adenopathy.  Back Exam:  Shows some slight kyphosis.  She has no tenderness over the spine, ribs, or hips.  Extremities:  Show some arthritic changes in her joints.  She has decent range of motion of her joints.  She has good pulses in her distal extremities.  Neurological Exam:  Shows no focal neurological deficits.  LABORATORY STUDIES:  White cell count is 7.6, hemoglobin 15, hematocrit 43, platelet count 282.  IMPRESSION:  Judy Norman is a 78 year old postmenopausal white female with history of stage I infiltrating ductal carcinoma of the left breast.  She underwent  lumpectomy.  She did not require any adjuvant chemotherapy.  She was placed on Femara.  She will continue Femara until November 2013.  She will finish this at the end of November this year.  We will go ahead and plan to see her back in December.  Judy Norman also continues to take her vitamin D and calcium and aspirin.    ______________________________ Josph Macho, M.D. PRE/MEDQ  D:  12/20/2011  T:  12/21/2011  Job:  [id number]

## 2011-12-31 ENCOUNTER — Telehealth: Payer: Self-pay | Admitting: Internal Medicine

## 2011-12-31 MED ORDER — SIMVASTATIN 40 MG PO TABS: 20.0000 mg | ORAL_TABLET | Freq: Every day | ORAL | Status: DC

## 2011-12-31 NOTE — Telephone Encounter (Signed)
Refill- zocor 40mg  tab. Take one tablet at bedtime. Qty 30 last fill 4.29.13

## 2011-12-31 NOTE — Telephone Encounter (Signed)
Done/SLS 

## 2012-01-07 ENCOUNTER — Other Ambulatory Visit: Payer: Self-pay | Admitting: Internal Medicine

## 2012-01-07 DIAGNOSIS — E538 Deficiency of other specified B group vitamins: Secondary | ICD-10-CM

## 2012-01-07 DIAGNOSIS — Z79899 Other long term (current) drug therapy: Secondary | ICD-10-CM

## 2012-01-07 DIAGNOSIS — E785 Hyperlipidemia, unspecified: Secondary | ICD-10-CM

## 2012-01-07 DIAGNOSIS — I10 Essential (primary) hypertension: Secondary | ICD-10-CM

## 2012-01-07 DIAGNOSIS — E039 Hypothyroidism, unspecified: Secondary | ICD-10-CM

## 2012-01-07 LAB — BASIC METABOLIC PANEL
CO2: 31 mEq/L (ref 19–32)
Calcium: 10 mg/dL (ref 8.4–10.5)
Creat: 0.93 mg/dL (ref 0.50–1.10)
Glucose, Bld: 75 mg/dL (ref 70–99)

## 2012-01-07 LAB — HEPATIC FUNCTION PANEL
Alkaline Phosphatase: 44 U/L (ref 39–117)
Indirect Bilirubin: 0.4 mg/dL (ref 0.0–0.9)
Total Bilirubin: 0.5 mg/dL (ref 0.3–1.2)

## 2012-01-07 LAB — CBC
MCH: 31.5 pg (ref 26.0–34.0)
MCHC: 33 g/dL (ref 30.0–36.0)
MCV: 95.7 fL (ref 78.0–100.0)
Platelets: 305 10*3/uL (ref 150–400)
RBC: 4.66 MIL/uL (ref 3.87–5.11)

## 2012-01-08 LAB — LIPID PANEL
LDL Cholesterol: 96 mg/dL (ref 0–99)
Triglycerides: 131 mg/dL (ref <150)

## 2012-01-08 LAB — TSH: TSH: 0.968 u[IU]/mL (ref 0.350–4.500)

## 2012-01-14 ENCOUNTER — Encounter: Payer: Self-pay | Admitting: Internal Medicine

## 2012-01-14 ENCOUNTER — Ambulatory Visit (INDEPENDENT_AMBULATORY_CARE_PROVIDER_SITE_OTHER): Payer: Medicare Other | Admitting: Internal Medicine

## 2012-01-14 VITALS — BP 118/62 | HR 68 | Temp 97.9°F | Resp 14 | Ht 61.25 in | Wt 116.5 lb

## 2012-01-14 DIAGNOSIS — I10 Essential (primary) hypertension: Secondary | ICD-10-CM

## 2012-01-14 DIAGNOSIS — Z79899 Other long term (current) drug therapy: Secondary | ICD-10-CM

## 2012-01-14 DIAGNOSIS — E559 Vitamin D deficiency, unspecified: Secondary | ICD-10-CM

## 2012-01-14 DIAGNOSIS — E538 Deficiency of other specified B group vitamins: Secondary | ICD-10-CM

## 2012-01-14 DIAGNOSIS — E039 Hypothyroidism, unspecified: Secondary | ICD-10-CM

## 2012-01-14 DIAGNOSIS — E785 Hyperlipidemia, unspecified: Secondary | ICD-10-CM

## 2012-01-14 DIAGNOSIS — R011 Cardiac murmur, unspecified: Secondary | ICD-10-CM

## 2012-01-14 NOTE — Patient Instructions (Addendum)
We are in the process of scheduling your echocardiogram (heart ultrasound) Please schedule fasting labs prior to your next visit (chem7-v58.69, tsh/free t4-hypothyroidism, b12-b12 deficiency and lipid/lft-272.4)

## 2012-01-14 NOTE — Assessment & Plan Note (Signed)
asx without cp, dyspnea or syncope. Schedule 2d echocardiogram.

## 2012-01-14 NOTE — Progress Notes (Signed)
  Subjective:    Patient ID: Judy Norman, female    DOB: [birth year], 78 y.o.   MRN: [id number]  HPI Pt presents to clinic for followup of multiple medical problems. BP reviewed normotensive. H/o hypothyroidism and tsh reviewed normal. Compliant with medication without adverse effect.   Past Medical History  Diagnosis Date  . Hypothyroidism   . Hypertension     benign  . Cancer     thyroid  . Adenocarcinoma of breast   . Diverticular disease   . Colon polyps   . Hyperlipidemia    Past Surgical History  Procedure Date  . Thyroidectomy   . Breast lumpectomy     reports that she has never smoked. She has never used smokeless tobacco. She reports that she does not drink alcohol. Her drug history not on file. family history is not on file. Allergies  Allergen Reactions  . Sulfonamide Derivatives       Review of Systems see hpi     Objective:   Physical Exam  Physical Exam  Nursing note and vitals reviewed. Constitutional: Appears well-developed and well-nourished. No distress.  HENT:  Head: Normocephalic and atraumatic.  Right Ear: External ear normal.  Left Ear: External ear normal.  Eyes: Conjunctivae are normal. No scleral icterus.  Neck: Neck supple. Carotid bruit is not present.  Cardiovascular: Normal rate, regular rhythm and normal heart sounds.  Exam reveals no gallop and no friction rub.   3/6 SM. Pulmonary/Chest: Effort normal and breath sounds normal. No respiratory distress. He has no wheezes. no rales.  Lymphadenopathy:    He has no cervical adenopathy.  Neurological:Alert.  Skin: Skin is warm and dry. Not diaphoretic.  Psychiatric: Has a normal mood and affect.        Assessment & Plan:

## 2012-01-14 NOTE — Assessment & Plan Note (Signed)
Normotensive and stable. Continue current regimen. Monitor bp as outpt and followup in clinic as scheduled.  

## 2012-01-14 NOTE — Assessment & Plan Note (Signed)
Hold b12 supplement 3-4 weeks prior to next labs and recheck b12 level to determine need.

## 2012-01-16 ENCOUNTER — Telehealth: Payer: Self-pay | Admitting: Internal Medicine

## 2012-01-16 NOTE — Telephone Encounter (Signed)
Patient is requesting that last lab results be mailed to her.

## 2012-01-16 NOTE — Telephone Encounter (Signed)
Printed and placed in outgoing mail/SLS

## 2012-01-17 ENCOUNTER — Other Ambulatory Visit (HOSPITAL_COMMUNITY): Payer: Medicare Other

## 2012-02-06 ENCOUNTER — Telehealth: Payer: Self-pay | Admitting: Internal Medicine

## 2012-02-06 NOTE — Telephone Encounter (Signed)
Patient called again requesting results. She states that she had Echo done at [facility] cardiology

## 2012-02-06 NOTE — Telephone Encounter (Signed)
Patient is requesting echo results.

## 2012-02-06 NOTE — Telephone Encounter (Signed)
Can you see if that cardiology office will fax results? i see no results in the emr

## 2012-02-06 NOTE — Telephone Encounter (Signed)
I cannot see as this testing was performed [cancellation on 07.15.13 appt]/SLS Please advise.

## 2012-02-07 NOTE — Telephone Encounter (Signed)
Normal heart function. One valve is slightly leaky and one is slightly thickened. Common findings and nothing needs to be done.

## 2012-02-07 NOTE — Telephone Encounter (Signed)
Received results from [facility]. Will send to provider for review.

## 2012-02-07 NOTE — Telephone Encounter (Signed)
LMOM with contact name & number to inform patient of normal findings/SLS

## 2012-02-11 ENCOUNTER — Encounter: Payer: Self-pay | Admitting: Internal Medicine

## 2012-02-11 ENCOUNTER — Ambulatory Visit (INDEPENDENT_AMBULATORY_CARE_PROVIDER_SITE_OTHER): Payer: Medicare Other | Admitting: Internal Medicine

## 2012-02-11 ENCOUNTER — Ambulatory Visit (HOSPITAL_BASED_OUTPATIENT_CLINIC_OR_DEPARTMENT_OTHER)
Admission: RE | Admit: 2012-02-11 | Discharge: 2012-02-11 | Disposition: A | Discharge status: Home / Self Care | Payer: Medicare Other | Source: Ambulatory Visit | Attending: Internal Medicine | Admitting: Internal Medicine

## 2012-02-11 VITALS — BP 110/62 | HR 104 | Wt 117.0 lb

## 2012-02-11 DIAGNOSIS — S92919A Unspecified fracture of unspecified toe(s), initial encounter for closed fracture: Secondary | ICD-10-CM | POA: Insufficient documentation

## 2012-02-11 DIAGNOSIS — M79672 Pain in left foot: Secondary | ICD-10-CM

## 2012-02-11 DIAGNOSIS — IMO0002 Reserved for concepts with insufficient information to code with codable children: Secondary | ICD-10-CM | POA: Insufficient documentation

## 2012-02-11 DIAGNOSIS — M79609 Pain in unspecified limb: Secondary | ICD-10-CM

## 2012-02-11 NOTE — Progress Notes (Signed)
  Subjective:    Patient ID: Judy Norman, female    DOB: 01-Sep-1931, 78 y.o.   MRN: [id number]  HPI Pt presents to clinic for evaluation of foot pain and swelling. States struck distal left 5th toe area three days ago. Has only mild pain and ambulates without difficulty. However did develop moderate soft tissue swelling over left dorsal foot behind the 5th toe to the mid foot. Taking no medication for the problem. No alleviating or exacerbating factors. Did use ice at the time of injury.   Past Medical History  Diagnosis Date  . Hypothyroidism   . Hypertension     benign  . Cancer     thyroid  . Adenocarcinoma of breast   . Diverticular disease   . Colon polyps   . Hyperlipidemia    Past Surgical History  Procedure Date  . Thyroidectomy   . Breast lumpectomy     reports that she has never smoked. She has never used smokeless tobacco. She reports that she does not drink alcohol. Her drug history not on file. family history is not on file. Allergies  Allergen Reactions  . Sulfonamide Derivatives      Review of Systems see hpi     Objective:   Physical Exam  Nursing note and vitals reviewed. Constitutional: She appears well-developed and well-nourished. No distress.  Musculoskeletal:       Left foot: NT without bony abn. Able to weight bear and ambulate without assistance/difficulty. Mild ST swelling dorsal foot. No wound or break in skin.   Neurological: She is alert.  Skin: Skin is warm and dry. She is not diaphoretic. No erythema.  Psychiatric: She has a normal mood and affect.          Assessment & Plan:

## 2012-02-11 NOTE — Assessment & Plan Note (Signed)
Obtain plain xray of left foot. Followup if no improvement or worsening.

## 2012-02-18 ENCOUNTER — Encounter: Payer: Self-pay | Admitting: Internal Medicine

## 2012-02-21 ENCOUNTER — Telehealth: Payer: Self-pay | Admitting: Internal Medicine

## 2012-02-21 MED ORDER — TRIAMTERENE-HCTZ 37.5-25 MG PO TABS: 1.0000 | ORAL_TABLET | Freq: Every day | ORAL | Status: DC

## 2012-02-21 NOTE — Telephone Encounter (Signed)
Refill- triamterene-hctz 37.5-25mg  tab. Take one tablet by mouth one time daily. Qty 90 last fill 5.27.13

## 2012-03-04 ENCOUNTER — Telehealth: Payer: Self-pay | Admitting: Internal Medicine

## 2012-03-04 MED ORDER — ALENDRONATE SODIUM 70 MG PO TABS: 70.0000 mg | ORAL_TABLET | ORAL | Status: DC

## 2012-03-04 NOTE — Telephone Encounter (Signed)
Refill- fosamax 70mg  tablet. Take one tablet by mouth weekly with full glass of water. No food or drink and do not lay down for 30 minutes after taken. Qty 4 last fill 8.1.13

## 2012-03-04 NOTE — Telephone Encounter (Signed)
Rx done, faxed/SLS

## 2012-03-10 ENCOUNTER — Telehealth: Payer: Self-pay | Admitting: Internal Medicine

## 2012-03-10 MED ORDER — ENALAPRIL MALEATE 20 MG PO TABS: 20.0000 mg | ORAL_TABLET | Freq: Every day | ORAL | Status: DC

## 2012-03-10 NOTE — Telephone Encounter (Signed)
Rx done/SLS 

## 2012-03-10 NOTE — Telephone Encounter (Signed)
Refill- enalapril maleate 20mg  tab. Take one tablet by mouth twice daily. Qty 180 last fill 6.10.13

## 2012-03-10 NOTE — Telephone Encounter (Signed)
Enalapril request: Medication shows change [07.15.13 OV] from [1] tab BID at start of encounter to [1] tab Daily at end of encounter; listed in EMR medication list as [1] tab Daily, request id for [1] BID/SLS Please confirm correct dosage, Thanks.

## 2012-03-10 NOTE — Telephone Encounter (Signed)
i thought she was taking it QD.

## 2012-03-11 ENCOUNTER — Telehealth: Payer: Self-pay | Admitting: Internal Medicine

## 2012-03-11 NOTE — Telephone Encounter (Signed)
enalpril 20 mg take 1 tablet by mouth twice daily  Qty 180  This is the old rx.   She picked up her new rx today it is enalpril 20 mg take 1 tablet by mouth one time per day and a qty of 30. This makes it so she has to pay 3 copays.  And the sig is different and Dr Rodena Medin did not mention that to her   Please clarify

## 2012-03-11 NOTE — Telephone Encounter (Signed)
THERE IS A 09.09.13 PHONE NOTE ABOUT THIS MATTER: Patient informed that daily dosage was changed at her 07.15.13 Sedonia Small, BENIGN - Estill Cotta, MD 01/14/2012 12:44 PM Signed Normotensive and stable] and that this change was reflected on her AVS; pt understood & agreed/SLS

## 2012-03-24 ENCOUNTER — Other Ambulatory Visit: Payer: Self-pay | Admitting: Hematology & Oncology

## 2012-03-24 DIAGNOSIS — Z853 Personal history of malignant neoplasm of breast: Secondary | ICD-10-CM

## 2012-04-22 ENCOUNTER — Telehealth: Payer: Self-pay | Admitting: Internal Medicine

## 2012-04-22 MED ORDER — LEVOTHYROXINE SODIUM 88 MCG PO TABS: 88.0000 ug | ORAL_TABLET | Freq: Every day | ORAL | Status: DC

## 2012-04-22 NOTE — Telephone Encounter (Signed)
Refill- levothyroxine sodium tab. Take one tablet by mouth one time daily. Qty 30 last fill 8.27.13

## 2012-04-22 NOTE — Telephone Encounter (Signed)
Patient walked in the office stating that prescription has not been called in yet. I explained to her that Dr. Rodena Medin has been seeing patients today. That it can take up to two business days before refill can be sent in. She states that she will be leaving tomorrow morning to go out of town and would like prescription refilled before then.

## 2012-04-22 NOTE — Telephone Encounter (Signed)
Rx to pharmacy/SLS 

## 2012-04-28 ENCOUNTER — Ambulatory Visit
Admission: RE | Admit: 2012-04-28 | Discharge: 2012-04-28 | Disposition: A | Discharge status: Home / Self Care | Payer: Medicare Other | Source: Ambulatory Visit | Attending: Hematology & Oncology | Admitting: Hematology & Oncology

## 2012-04-28 DIAGNOSIS — Z853 Personal history of malignant neoplasm of breast: Secondary | ICD-10-CM

## 2012-06-04 ENCOUNTER — Telehealth: Payer: Self-pay | Admitting: Hematology & Oncology

## 2012-06-04 NOTE — Telephone Encounter (Signed)
Patient walked in and cx 06/20/12 apt and resch 07/31/11

## 2012-06-20 ENCOUNTER — Other Ambulatory Visit: Payer: Medicare Other | Admitting: Lab

## 2012-06-20 ENCOUNTER — Ambulatory Visit: Payer: Medicare Other | Admitting: Hematology & Oncology

## 2012-06-24 ENCOUNTER — Other Ambulatory Visit: Payer: Self-pay | Admitting: *Deleted

## 2012-06-24 MED ORDER — SIMVASTATIN 40 MG PO TABS: ORAL_TABLET | ORAL | Status: DC

## 2012-06-24 NOTE — Telephone Encounter (Signed)
Rx to pharmacy/SLS 

## 2012-07-08 LAB — BASIC METABOLIC PANEL
BUN: 20 mg/dL (ref 6–23)
CO2: 27 mEq/L (ref 19–32)
Chloride: 102 mEq/L (ref 96–112)
Creat: 1.03 mg/dL (ref 0.50–1.10)

## 2012-07-08 LAB — LIPID PANEL
Cholesterol: 191 mg/dL (ref 0–200)
Total CHOL/HDL Ratio: 3.5 Ratio
VLDL: 22 mg/dL (ref 0–40)

## 2012-07-08 LAB — HEPATIC FUNCTION PANEL
ALT: 16 U/L (ref 0–35)
Bilirubin, Direct: 0.1 mg/dL (ref 0.0–0.3)
Total Bilirubin: 0.7 mg/dL (ref 0.3–1.2)

## 2012-07-08 LAB — VITAMIN B12: Vitamin B-12: 1104 pg/mL — ABNORMAL HIGH (ref 211–911)

## 2012-07-08 NOTE — Addendum Note (Signed)
Addended by: Regis Bill on: 07/08/2012 12:11 PM   Modules accepted: Orders

## 2012-07-14 ENCOUNTER — Ambulatory Visit (INDEPENDENT_AMBULATORY_CARE_PROVIDER_SITE_OTHER): Payer: Medicare Other | Admitting: Internal Medicine

## 2012-07-14 ENCOUNTER — Encounter: Payer: Self-pay | Admitting: Internal Medicine

## 2012-07-14 VITALS — BP 118/74 | HR 68 | Temp 97.8°F | Resp 14 | Ht 61.25 in | Wt 117.5 lb

## 2012-07-14 DIAGNOSIS — Z23 Encounter for immunization: Secondary | ICD-10-CM

## 2012-07-14 DIAGNOSIS — E785 Hyperlipidemia, unspecified: Secondary | ICD-10-CM

## 2012-07-14 DIAGNOSIS — E039 Hypothyroidism, unspecified: Secondary | ICD-10-CM

## 2012-07-14 DIAGNOSIS — E538 Deficiency of other specified B group vitamins: Secondary | ICD-10-CM

## 2012-07-14 DIAGNOSIS — I10 Essential (primary) hypertension: Secondary | ICD-10-CM

## 2012-07-14 NOTE — Assessment & Plan Note (Signed)
Stable.  Continue current dosing. °

## 2012-07-14 NOTE — Patient Instructions (Signed)
Please schedule fasting labs prior to next visit chem7-v58.69, b12-b12 deficiency, lipid/lft-272.4 and tsh, free t4-hypothyroidism

## 2012-07-14 NOTE — Assessment & Plan Note (Signed)
Stable. Continue current statin dosing.

## 2012-07-14 NOTE — Assessment & Plan Note (Signed)
Pt requests to stop b12 supplement. Recheck b12 level with next labs off medication

## 2012-07-14 NOTE — Assessment & Plan Note (Signed)
Normotensive and stable. Continue current regimen. Monitor bp as outpt and followup in clinic as scheduled.  

## 2012-07-14 NOTE — Progress Notes (Signed)
  Subjective:    Patient ID: Judy Norman, female    DOB: 14-Nov-1931, 79 y.o.   MRN: [id number]  HPI Pt presents to clinic for followup of multiple medical problems. BP reviewed as normotensive and wt stable. b12 level remains adequate despite reducing supplementation from to 500 mcg qd. Labs reviewed nl and copy provided.   Past Medical History  Diagnosis Date  . Hypothyroidism   . Hypertension     benign  . Cancer     thyroid  . Adenocarcinoma of breast   . Diverticular disease   . Colon polyps   . Hyperlipidemia    Past Surgical History  Procedure Date  . Thyroidectomy   . Breast lumpectomy     reports that she has never smoked. She has never used smokeless tobacco. She reports that she does not drink alcohol. Her drug history not on file. family history is not on file. Allergies  Allergen Reactions  . Sulfonamide Derivatives       Review of Systems see hpi     Objective:   Physical Exam  Physical Exam  Nursing note and vitals reviewed. Constitutional: Appears well-developed and well-nourished. No distress.  HENT:  Head: Normocephalic and atraumatic.  Right Ear: External ear normal.  Left Ear: External ear normal.  Eyes: Conjunctivae are normal. No scleral icterus.  Neck: Neck supple. Carotid bruit is not present. no thyroid mass or thyromegaly Cardiovascular: Normal rate, regular rhythm and normal heart sounds.  Exam reveals no gallop and no friction rub.   +3/6 sm Pulmonary/Chest: Effort normal and breath sounds normal. No respiratory distress. He has no wheezes. no rales.  Lymphadenopathy:    He has no cervical adenopathy.  Neurological:Alert.  Skin: Skin is warm and dry. Not diaphoretic.  Psychiatric: Has a normal mood and affect.        Assessment & Plan:

## 2012-07-14 NOTE — Addendum Note (Signed)
Addended by: Regis Bill on: 07/14/2012 10:47 AM   Modules accepted: Orders

## 2012-07-30 ENCOUNTER — Ambulatory Visit (HOSPITAL_BASED_OUTPATIENT_CLINIC_OR_DEPARTMENT_OTHER): Payer: Medicare Other | Admitting: Hematology & Oncology

## 2012-07-30 ENCOUNTER — Telehealth: Payer: Self-pay | Admitting: Internal Medicine

## 2012-07-30 ENCOUNTER — Other Ambulatory Visit (HOSPITAL_BASED_OUTPATIENT_CLINIC_OR_DEPARTMENT_OTHER): Payer: Medicare Other | Admitting: Lab

## 2012-07-30 ENCOUNTER — Other Ambulatory Visit: Payer: Medicare Other | Admitting: Lab

## 2012-07-30 ENCOUNTER — Ambulatory Visit: Payer: Medicare Other | Admitting: Hematology & Oncology

## 2012-07-30 VITALS — BP 156/70 | HR 86 | Temp 97.6°F | Resp 16 | Ht 61.0 in | Wt 117.0 lb

## 2012-07-30 DIAGNOSIS — M810 Age-related osteoporosis without current pathological fracture: Secondary | ICD-10-CM

## 2012-07-30 DIAGNOSIS — Z853 Personal history of malignant neoplasm of breast: Secondary | ICD-10-CM

## 2012-07-30 DIAGNOSIS — C50919 Malignant neoplasm of unspecified site of unspecified female breast: Secondary | ICD-10-CM

## 2012-07-30 LAB — COMPREHENSIVE METABOLIC PANEL
ALT: 13 U/L (ref 0–35)
Albumin: 4.3 g/dL (ref 3.5–5.2)
CO2: 32 mEq/L (ref 19–32)
Calcium: 10.3 mg/dL (ref 8.4–10.5)
Chloride: 99 mEq/L (ref 96–112)
Creatinine, Ser: 0.96 mg/dL (ref 0.50–1.10)
Potassium: 4.5 mEq/L (ref 3.5–5.3)
Sodium: 138 mEq/L (ref 135–145)
Total Protein: 7.2 g/dL (ref 6.0–8.3)

## 2012-07-30 LAB — CBC WITH DIFFERENTIAL (CANCER CENTER ONLY)
BASO%: 0.3 % (ref 0.0–2.0)
HCT: 44.6 % (ref 34.8–46.6)
LYMPH#: 1 10*3/uL (ref 0.9–3.3)
MONO#: 0.5 10*3/uL (ref 0.1–0.9)
NEUT%: 76.5 % (ref 39.6–80.0)
RDW: 14 % (ref 11.1–15.7)
WBC: 6.7 10*3/uL (ref 3.9–10.0)

## 2012-07-30 MED ORDER — LEVOTHYROXINE SODIUM 88 MCG PO TABS: 88.0000 ug | ORAL_TABLET | Freq: Every day | ORAL | Status: DC

## 2012-07-30 NOTE — Telephone Encounter (Signed)
90 days supply request  Levothyroxine 0.088mg (57mcg)tab. Take one tablet by mouth every day.

## 2012-07-30 NOTE — Progress Notes (Signed)
This office note has been dictated.

## 2012-07-31 NOTE — Progress Notes (Signed)
CC:   Judy Arbour, MD  DIAGNOSIS:  Stage I (T1 N0 M0) ductal carcinoma of the left breast.  CURRENT THERAPY:  Observation (the patient completed 5 years of Femara in November of 2013).  INTERIM HISTORY:  Judy Norman comes in for followup.  Again, she completed her Femara last year.  She is doing well.  She has had no complaints.  She enjoyed the holidays.  We last saw her back in June of last year.  She has not done any kind of traveling.  She is just taking "one day at a time."  There have been no health issues with her.  She has had no nausea or vomiting.  She has had no change in bowel or bladder habits. She has had no change in her medications.  She is on Synthroid.  This is being followed by Judy Norman.  Her last mammogram, I think, was last October.  The mammogram did not show any evidence of suspicious microcalcifications.  PHYSICAL EXAMINATION:  This is a petite white female in no obvious distress.  Vital signs:  Temperature 97.6, pulse 86, respiratory rate 16, blood pressure 156/70.  Weight is 117.  Head/neck:  Normocephalic, atraumatic skull.  There are no ocular or oral lesions.  There are no palpable cervical or supraclavicular lymph nodes.  Lungs:  Clear bilaterally.  Cardiac:  Regular rate and rhythm with a normal S1, S2. There are no murmurs, rubs or bruits.  Breasts:  Shows right breast with no masses, edema or erythema.  There is no right axillary adenopathy. The left breast shows a well-healed lumpectomy at the 6 o'clock position.  No distinct mass was noted in the left breast.  There is no left axillary adenopathy.  Abdomen:  Soft with good bowel sounds.  There is no palpable abdominal mass.  There is no fluid wave.  There is no palpable hepatosplenomegaly.  Back:  No tenderness over the spine, ribs, or hips.  Extremities:  No clubbing, cyanosis or edema.  LABORATORY STUDIES:  White cell count is 6.7, hemoglobin 15.4, hematocrit 44.6, platelet count  283.  IMPRESSION:  Judy Norman is a 79 year old white female with a history of stage I ductal carcinoma of the left breast.  She has no clinical evidence of recurrence.  She is now off Femara.  We will plan to get her back in 8 months.  I think this is reasonable.  I told her to make sure to take her vitamin D.   ______________________________ Josph Macho, M.D. PRE/MEDQ  D:  07/30/2012  T:  07/30/2012  Job:  [id number]

## 2012-08-01 ENCOUNTER — Encounter: Payer: Self-pay | Admitting: *Deleted

## 2012-08-01 ENCOUNTER — Telehealth: Payer: Self-pay | Admitting: *Deleted

## 2012-08-01 NOTE — Telephone Encounter (Signed)
Patient called to let her know that her labs are all ok per dr. Myna Hidalgo.

## 2012-08-01 NOTE — Telephone Encounter (Signed)
Message copied by Anselm Jungling on Fri Aug 01, 2012 11:30 AM ------      Message from: Arlan Organ R      Created: Wed Jul 30, 2012  9:17 PM       Call - labs are ok!!! Cindee Lame

## 2012-08-12 ENCOUNTER — Other Ambulatory Visit: Payer: Self-pay

## 2012-08-12 MED ORDER — ALENDRONATE SODIUM 70 MG PO TABS: 70.0000 mg | ORAL_TABLET | ORAL | Status: DC

## 2012-08-18 ENCOUNTER — Telehealth: Payer: Self-pay | Admitting: Internal Medicine

## 2012-08-18 MED ORDER — TRIAMTERENE-HCTZ 37.5-25 MG PO TABS: 1.0000 | ORAL_TABLET | Freq: Every day | ORAL | Status: DC

## 2012-08-18 NOTE — Telephone Encounter (Signed)
Refill- triamterene 37.5mg /hctz 25mg  tabs. Take one tablet by mouth one time daily. Qty 90 last fill 11.17.13

## 2012-09-15 ENCOUNTER — Other Ambulatory Visit: Payer: Self-pay

## 2012-09-15 MED ORDER — ENALAPRIL MALEATE 20 MG PO TABS: 20.0000 mg | ORAL_TABLET | Freq: Every day | ORAL | Status: DC

## 2012-12-05 IMAGING — CR DG FOOT COMPLETE 3+V*L*
3 series · 3 of 3 positions shown · non-contrast
Comparison: None.

CLINICAL DATA: 80-year-old female with blunt trauma to the foot.
Unable to walk.

LEFT FOOT - COMPLETE 3+ VIEW

[t foot ap left]
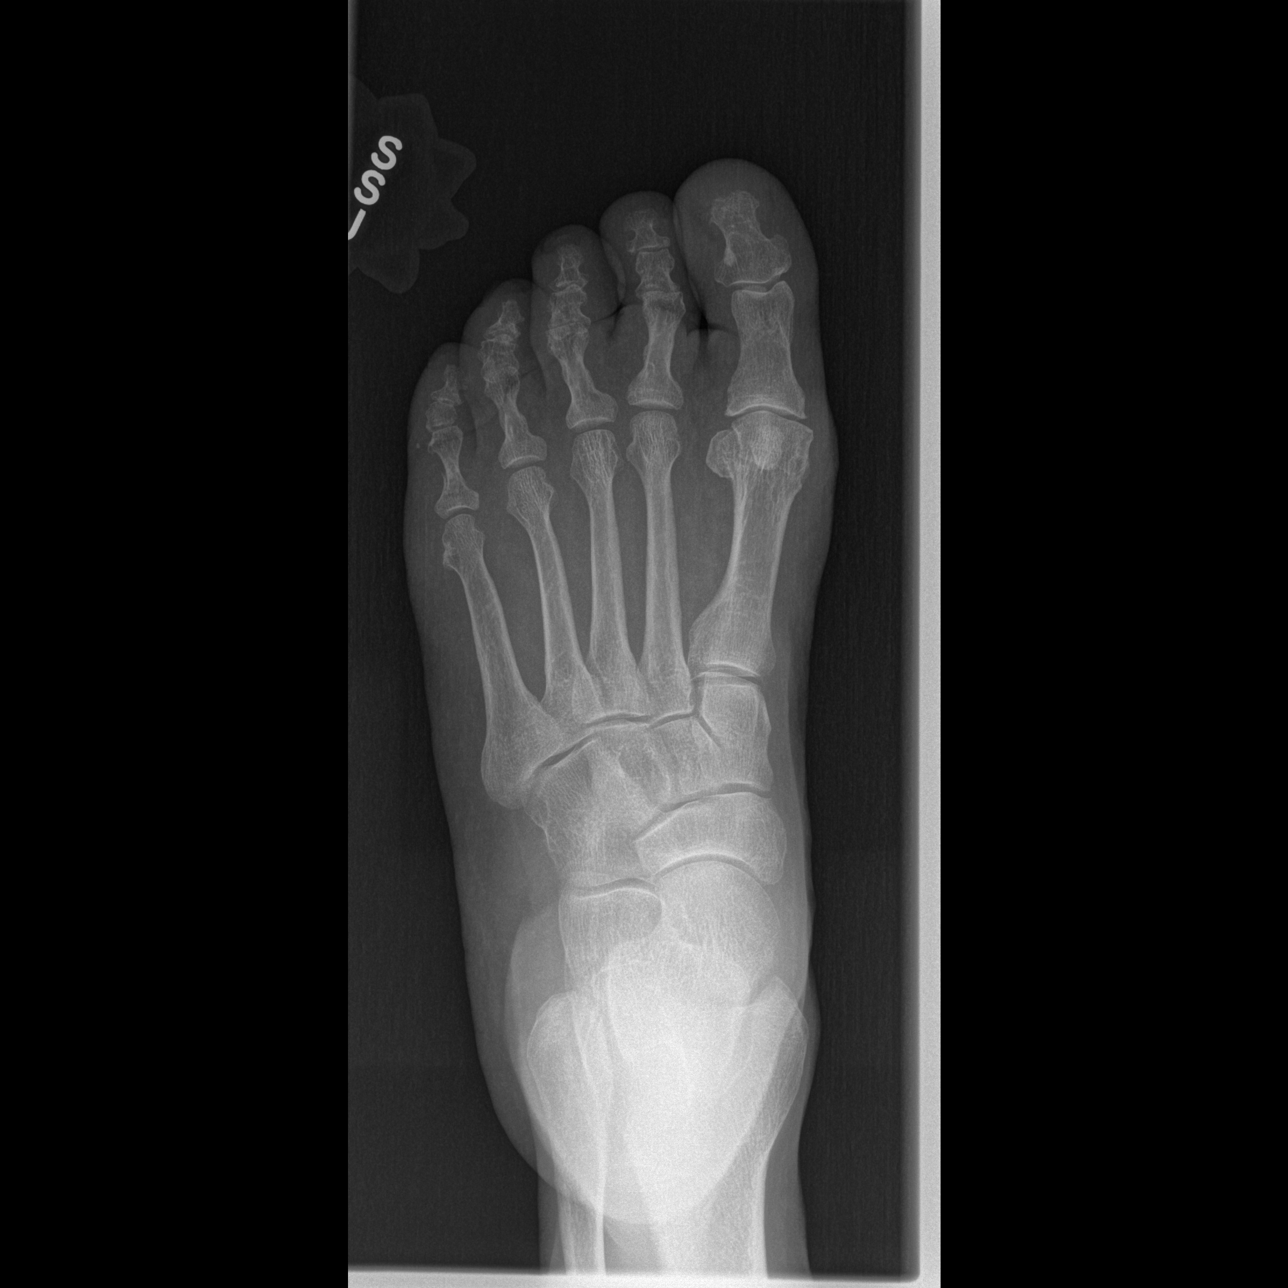

[t foot oblique left]
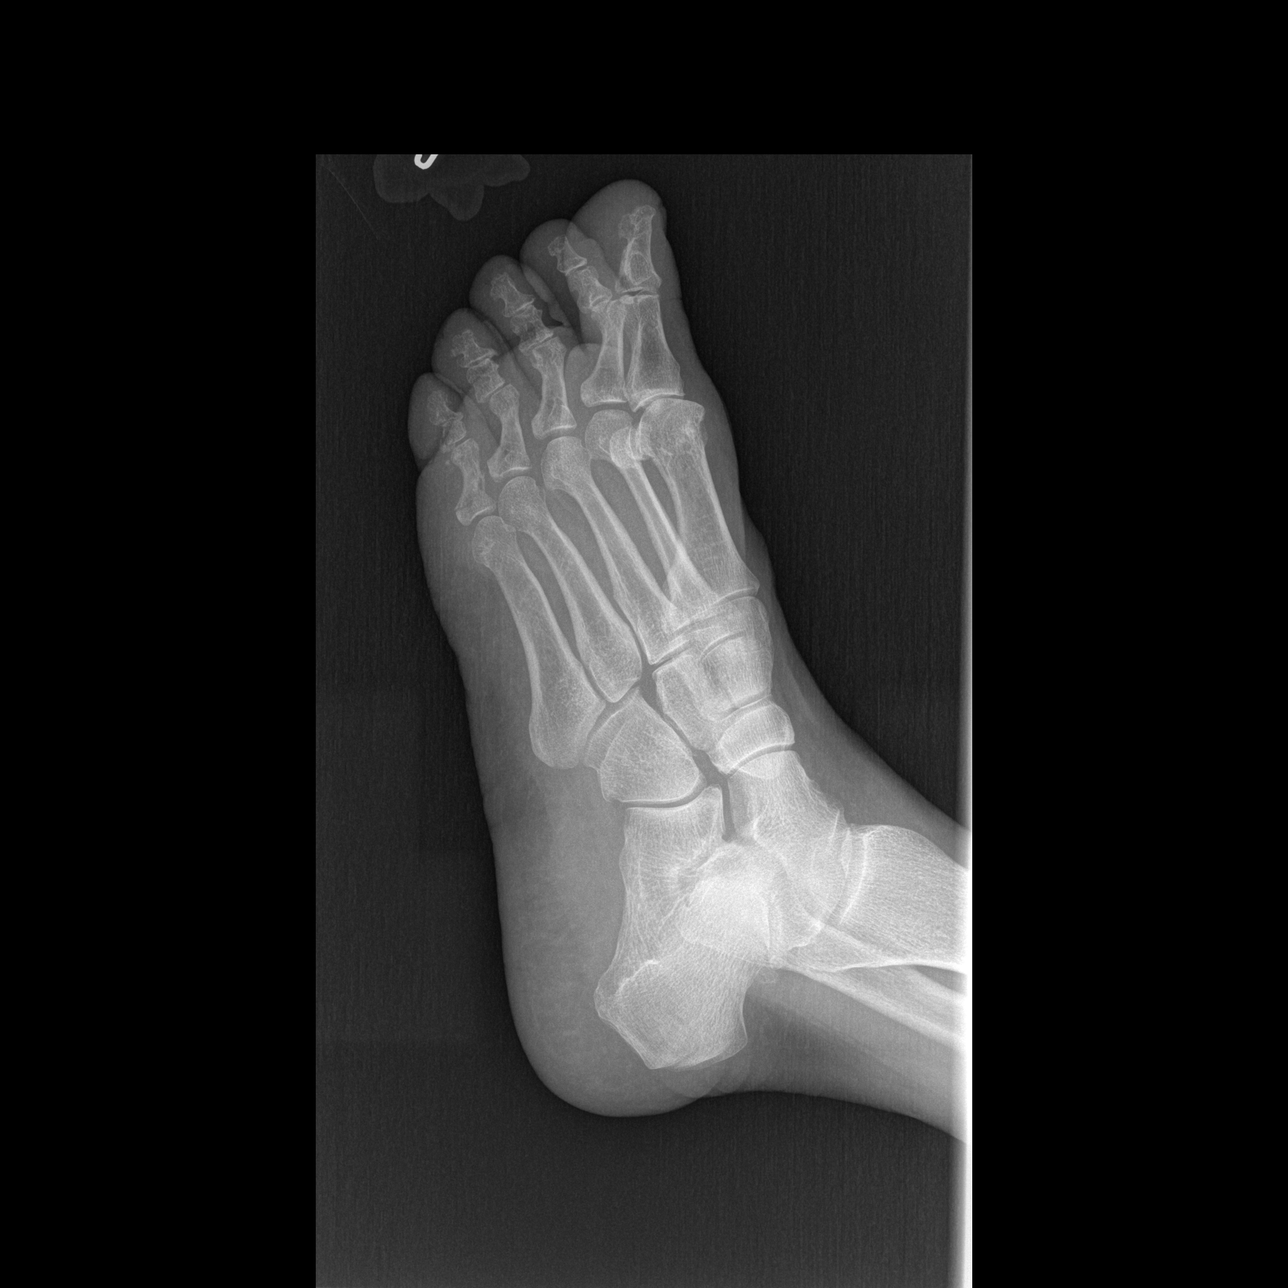

[t foot lat left]
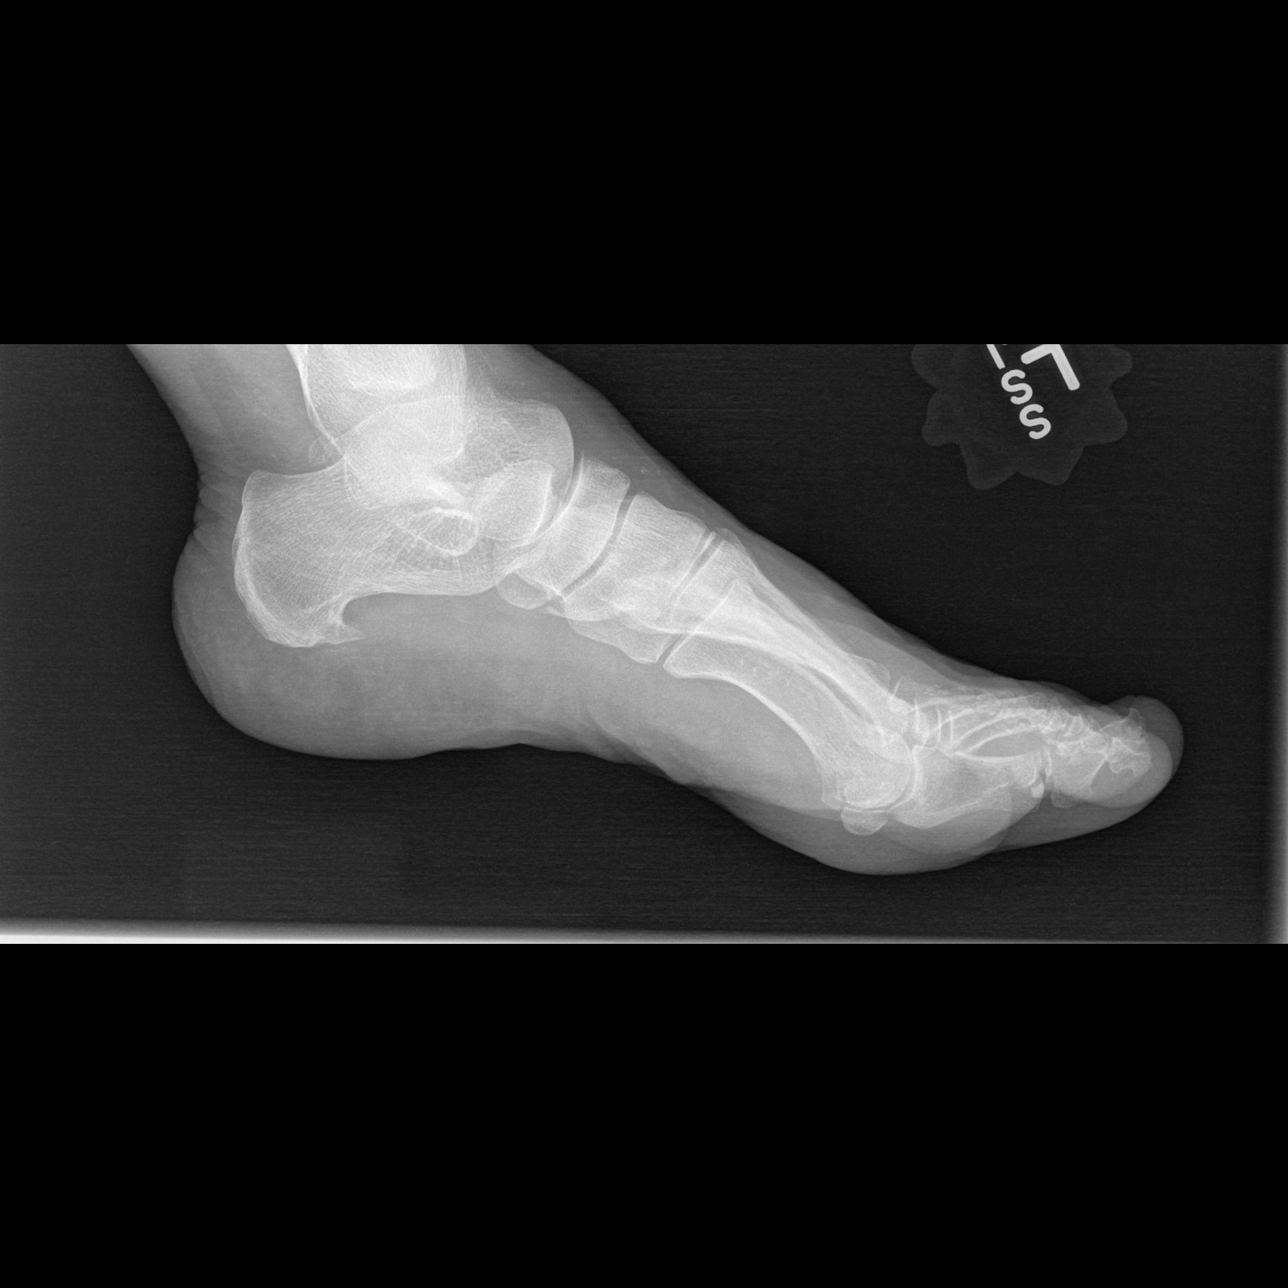

[3 of 3 positions shown; findings below may reference images not displayed]

FINDINGS: Comminuted minimally-displaced fracture through the
proximal shaft of the fifth proximal phalanx.  The fracture appears
extra-articular.  Superimposed probable chronic degenerative
spurring of the middle phalanx, less likely a small avulsion
fracture of the middle phalanx.

No other acute fracture of the phalanges, evidence of healed
fractures of the third and fourth proximal phalanges.  Metatarsals
appear intact.  Calcaneus intact with degenerative spurring.
IMPRESSION: 1.  Comminuted minimally-displaced fracture of the left fifth
proximal phalanx.
2.  Probable degenerative spurring at the fifth middle phalanx,
less likely a small avulsion fracture.
3.  No other acute fracture or dislocation identified in the left
foot.

## 2012-12-23 ENCOUNTER — Telehealth: Payer: Self-pay | Admitting: Hematology & Oncology

## 2012-12-23 NOTE — Telephone Encounter (Signed)
Left pt message changed time of 10-1 appointment

## 2012-12-24 ENCOUNTER — Telehealth: Payer: Self-pay | Admitting: Internal Medicine

## 2012-12-24 MED ORDER — SIMVASTATIN 40 MG PO TABS: ORAL_TABLET | ORAL | Status: DC

## 2012-12-24 NOTE — Telephone Encounter (Signed)
Refill- simvastatin 40mg  tablets. Take 1/2 tablet by mouth every day. Qty 30 last fill 4.24.14

## 2012-12-29 ENCOUNTER — Telehealth: Payer: Self-pay

## 2012-12-29 DIAGNOSIS — E785 Hyperlipidemia, unspecified: Secondary | ICD-10-CM

## 2012-12-29 DIAGNOSIS — E538 Deficiency of other specified B group vitamins: Secondary | ICD-10-CM

## 2012-12-29 DIAGNOSIS — Z79899 Other long term (current) drug therapy: Secondary | ICD-10-CM

## 2012-12-29 DIAGNOSIS — E039 Hypothyroidism, unspecified: Secondary | ICD-10-CM

## 2012-12-29 LAB — HEPATIC FUNCTION PANEL
ALT: 18 U/L (ref 0–35)
AST: 18 U/L (ref 0–37)
Bilirubin, Direct: 0.1 mg/dL (ref 0.0–0.3)
Indirect Bilirubin: 0.5 mg/dL (ref 0.0–0.9)
Total Bilirubin: 0.6 mg/dL (ref 0.3–1.2)

## 2012-12-29 LAB — VITAMIN B12: Vitamin B-12: 637 pg/mL (ref 211–911)

## 2012-12-29 LAB — BASIC METABOLIC PANEL
Chloride: 102 mEq/L (ref 96–112)
Potassium: 4 mEq/L (ref 3.5–5.3)
Sodium: 140 mEq/L (ref 135–145)

## 2012-12-29 LAB — T4, FREE: Free T4: 1.33 ng/dL (ref 0.80–1.80)

## 2012-12-29 LAB — LIPID PANEL: LDL Cholesterol: 95 mg/dL (ref 0–99)

## 2012-12-29 NOTE — Telephone Encounter (Signed)
pts labs ordered 

## 2013-01-06 ENCOUNTER — Ambulatory Visit (INDEPENDENT_AMBULATORY_CARE_PROVIDER_SITE_OTHER): Payer: Medicare Other | Admitting: Family Medicine

## 2013-01-06 ENCOUNTER — Encounter: Payer: Self-pay | Admitting: Family Medicine

## 2013-01-06 VITALS — BP 118/64 | HR 70 | Temp 98.2°F | Ht 61.0 in | Wt 115.1 lb

## 2013-01-06 DIAGNOSIS — E876 Hypokalemia: Secondary | ICD-10-CM

## 2013-01-06 DIAGNOSIS — M810 Age-related osteoporosis without current pathological fracture: Secondary | ICD-10-CM

## 2013-01-06 DIAGNOSIS — E785 Hyperlipidemia, unspecified: Secondary | ICD-10-CM

## 2013-01-06 DIAGNOSIS — I10 Essential (primary) hypertension: Secondary | ICD-10-CM

## 2013-01-06 NOTE — Assessment & Plan Note (Signed)
Resolved with labs

## 2013-01-06 NOTE — Assessment & Plan Note (Signed)
Tolerating zocor good response, avoid trans fats, no changes

## 2013-01-06 NOTE — Assessment & Plan Note (Signed)
Well controlled today no changes 

## 2013-01-06 NOTE — Patient Instructions (Addendum)
Next visit annual with labs prior to visit, lipid, renal, cbc, tsh, hepatic, freeT4  Hypothyroidism The thyroid is a large gland located in the lower front of your neck. The thyroid gland helps control metabolism. Metabolism is how your body handles food. It controls metabolism with the hormone thyroxine. When this gland is underactive (hypothyroid), it produces too little hormone.  CAUSES These include:   Absence or destruction of thyroid tissue.  Goiter due to iodine deficiency.  Goiter due to medications.  Congenital defects (since birth).  Problems with the pituitary. This causes a lack of TSH (thyroid stimulating hormone). This hormone tells the thyroid to turn out more hormone. SYMPTOMS  Lethargy (feeling as though you have no energy)  Cold intolerance  Weight gain (in spite of normal food intake)  Dry skin  Coarse hair  Menstrual irregularity (if severe, may lead to infertility)  Slowing of thought processes Cardiac problems are also caused by insufficient amounts of thyroid hormone. Hypothyroidism in the newborn is cretinism, and is an extreme form. It is important that this form be treated adequately and immediately or it will lead rapidly to retarded physical and mental development. DIAGNOSIS  To prove hypothyroidism, your caregiver may do blood tests and ultrasound tests. Sometimes the signs are hidden. It may be necessary for your caregiver to watch this illness with blood tests either before or after diagnosis and treatment. TREATMENT  Low levels of thyroid hormone are increased by using synthetic thyroid hormone. This is a safe, effective treatment. It usually takes about four weeks to gain the full effects of the medication. After you have the full effect of the medication, it will generally take another four weeks for problems to leave. Your caregiver may start you on low doses. If you have had heart problems the dose may be gradually increased. It is generally not  an emergency to get rapidly to normal. HOME CARE INSTRUCTIONS   Take your medications as your caregiver suggests. Let your caregiver know of any medications you are taking or start taking. Your caregiver will help you with dosage schedules.  As your condition improves, your dosage needs may increase. It will be necessary to have continuing blood tests as suggested by your caregiver.  Report all suspected medication side effects to your caregiver. SEEK MEDICAL CARE IF: Seek medical care if you develop:  Sweating.  Tremulousness (tremors).  Anxiety.  Rapid weight loss.  Heat intolerance.  Emotional swings.  Diarrhea.  Weakness. SEEK IMMEDIATE MEDICAL CARE IF:  You develop chest pain, an irregular heart beat (palpitations), or a rapid heart beat. MAKE SURE YOU:   Understand these instructions.  Will watch your condition.  Will get help right away if you are not doing well or get worse. Document Released: 06/18/2005 Document Revised: 09/10/2011 Document Reviewed: 02/06/2008 [facility] Patient Information 2014 [place], [place].

## 2013-01-06 NOTE — Progress Notes (Signed)
Patient ID: Judy Norman, female   DOB: Jun 14, 1932, 79 y.o.   MRN: [id number] Judy Norman [id number] 1932-02-13 01/06/2013      Progress Note-Follow Up  Subjective  Chief Complaint  Chief Complaint  Patient presents with  . Follow-up    6 month    HPI  Patient is a 79 year old female who is in today for followup. Feels very well. Did have an episode of hitting her head very hard on a car door 2 weeks ago but never had any visual or hearing changes. Never any headache, loss of consciousness or neurologic complaints. At this point she has no pain lasts but did initially have some pain with palpation just over the site in question. No other recent concerns. No chest pain, palpitations, shortness of breath, GI or GU concerns. And taking medications as prescribed  Past Medical History  Diagnosis Date  . Hypothyroidism   . Hypertension     benign  . Cancer     thyroid  . Adenocarcinoma of breast   . Diverticular disease   . Colon polyps   . Hyperlipidemia     Past Surgical History  Procedure Laterality Date  . Thyroidectomy    . Breast lumpectomy      History reviewed. No pertinent family history.  History   Social History  . Marital Status: Widowed    Spouse Name: N/A    Number of Children: N/A  . Years of Education: N/A   Occupational History  . housewife    Social History Main Topics  . Smoking status: Never Smoker   . Smokeless tobacco: Never Used  . Alcohol Use: No  . Drug Use: Not on file  . Sexually Active: Not on file   Other Topics Concern  . Not on file   Social History Narrative  . No narrative on file    Current Outpatient Prescriptions on File Prior to Visit  Medication Sig Dispense Refill  . alendronate (FOSAMAX) 70 MG tablet Take 1 tablet (70 mg total) by mouth once a week. Take in the morning with a full glass of water, on an empty stomach, and do not take anything else by mouth or lie down for the next 30 min.  4 tablet  5  . aspirin  81 MG EC tablet Take 81 mg by mouth daily.        . brimonidine (ALPHAGAN P) 0.1 % SOLN 1 drop 2 (two) times daily.      . calcium carbonate (OS-CAL) 600 MG TABS Take 600 mg by mouth 2 (two) times daily.        . cholecalciferol (VITAMIN D) 1000 UNITS tablet Take 1,600 Units by mouth daily.        . enalapril (VASOTEC) 20 MG tablet Take 1 tablet (20 mg total) by mouth daily.  90 tablet  1  . levothyroxine (SYNTHROID, LEVOTHROID) 88 MCG tablet Take 1 tablet (88 mcg total) by mouth daily.  90 tablet  1  . simvastatin (ZOCOR) 40 MG tablet Take 1/2 tablet  (20 mg total) once a day.  30 tablet  2  . triamterene-hydrochlorothiazide (MAXZIDE-25) 37.5-25 MG per tablet Take 1 each (1 tablet total) by mouth daily.  90 tablet  1   No current facility-administered medications on file prior to visit.    Allergies  Allergen Reactions  . Sulfonamide Derivatives     Review of Systems  Review of Systems  Constitutional: Negative for fever and malaise/fatigue.  HENT: Negative for congestion.  Eyes: Negative for pain and discharge.  Respiratory: Negative for shortness of breath.   Cardiovascular: Negative for chest pain, palpitations and leg swelling.  Gastrointestinal: Negative for nausea, abdominal pain and diarrhea.  Genitourinary: Negative for dysuria.  Musculoskeletal: Negative for falls.  Skin: Negative for rash.  Neurological: Negative for loss of consciousness and headaches.  Endo/Heme/Allergies: Negative for polydipsia.  Psychiatric/Behavioral: Negative for depression and suicidal ideas. The patient is not nervous/anxious and does not have insomnia.     Objective  BP 118/64  Pulse 70  Temp(Src) 98.2 F (36.8 C) (Oral)  Ht 5\' 1"  (1.549 m)  Wt 115 lb 1.3 oz (52.2 kg)  BMI 21.76 kg/m2  SpO2 97%  Physical Exam  Physical Exam  Constitutional: She is oriented to person, place, and time and well-developed, well-nourished, and in no distress. No distress.  HENT:  Head: Normocephalic  and atraumatic.  Eyes: Conjunctivae are normal.  Neck: Neck supple. No thyromegaly present.  Cardiovascular: Normal rate, regular rhythm and normal heart sounds.  Exam reveals no gallop.   No murmur heard. Pulmonary/Chest: Effort normal and breath sounds normal. She has no wheezes.  Abdominal: She exhibits no distension and no mass.  Musculoskeletal: She exhibits no edema.  Lymphadenopathy:    She has no cervical adenopathy.  Neurological: She is alert and oriented to person, place, and time.  Skin: Skin is warm and dry. No rash noted. She is not diaphoretic.  Psychiatric: Memory, affect and judgment normal.    Lab Results  Component Value Date   TSH 1.069 12/29/2012   Lab Results  Component Value Date   WBC 6.7 07/30/2012   HGB 15.4 07/30/2012   HCT 44.6 07/30/2012   MCV 94 07/30/2012   PLT 283 07/30/2012   Lab Results  Component Value Date   CREATININE 1.08 12/29/2012   BUN 20 12/29/2012   NA 140 12/29/2012   K 4.0 12/29/2012   CL 102 12/29/2012   CO2 30 12/29/2012   Lab Results  Component Value Date   ALT 18 12/29/2012   AST 18 12/29/2012   ALKPHOS 49 12/29/2012   BILITOT 0.6 12/29/2012   Lab Results  Component Value Date   CHOL 163 12/29/2012   Lab Results  Component Value Date   HDL 52 12/29/2012   Lab Results  Component Value Date   LDLCALC 95 12/29/2012   Lab Results  Component Value Date   TRIG 78 12/29/2012   Lab Results  Component Value Date   CHOLHDL 3.1 12/29/2012     Assessment & Plan  HYPERTENSION, BENIGN Well controlled today no changes  HYPOKALEMIA Resolved with labs  HYPERLIPIDEMIA Tolerating zocor good response, avoid trans fats, no changes  OSTEOPOROSIS Tolerating Fosamax encouraged adequate activity

## 2013-01-06 NOTE — Assessment & Plan Note (Signed)
Tolerating Fosamax encouraged adequate activity

## 2013-01-20 ENCOUNTER — Ambulatory Visit: Payer: Medicare Other | Admitting: Family Medicine

## 2013-02-10 ENCOUNTER — Telehealth: Payer: Self-pay | Admitting: Family Medicine

## 2013-02-10 MED ORDER — ALENDRONATE SODIUM 70 MG PO TABS: 70.0000 mg | ORAL_TABLET | ORAL | Status: DC

## 2013-02-10 NOTE — Telephone Encounter (Signed)
Refill- alendronate 70mg  tablets. Take one tablet by mouth once a week in AM with full glass of water on empty stomach. Do not take anything else or lie down for next 30 minutes. Qty 4 last fill 8.12.14

## 2013-02-19 ENCOUNTER — Other Ambulatory Visit: Payer: Self-pay | Admitting: *Deleted

## 2013-02-19 MED ORDER — TRIAMTERENE-HCTZ 37.5-25 MG PO TABS: 1.0000 | ORAL_TABLET | Freq: Every day | ORAL | Status: DC

## 2013-02-19 NOTE — Telephone Encounter (Signed)
Rx request to pharmacy/SLS  

## 2013-03-09 ENCOUNTER — Ambulatory Visit (INDEPENDENT_AMBULATORY_CARE_PROVIDER_SITE_OTHER): Payer: Medicare Other | Admitting: Physician Assistant

## 2013-03-09 ENCOUNTER — Encounter: Payer: Self-pay | Admitting: Physician Assistant

## 2013-03-09 VITALS — BP 122/68 | HR 74 | Temp 98.4°F | Resp 14 | Wt 117.0 lb

## 2013-03-09 DIAGNOSIS — G5602 Carpal tunnel syndrome, left upper limb: Secondary | ICD-10-CM

## 2013-03-09 DIAGNOSIS — G5600 Carpal tunnel syndrome, unspecified upper limb: Secondary | ICD-10-CM

## 2013-03-09 NOTE — Addendum Note (Signed)
Addended by: Regis Bill on: 03/09/2013 04:48 PM   Modules accepted: Orders

## 2013-03-09 NOTE — Assessment & Plan Note (Signed)
Mild exacerbation.  Most likely due to sleeping on affected side last night.  Wrist splint.  Topical aspercreme.  Follow-up in 1-2 weeks if symptoms persist or worsen.

## 2013-03-09 NOTE — Patient Instructions (Signed)
Please take ibuprofen every 6 hours as needed for pain.  Wear wrist splint each night, making sure to take off splint every 8 hours or so to let skin breathe.  Avoid sleeping on Left side.  Please return in 1-2 weeks if symptoms are not improving.  Carpal Tunnel Syndrome The carpal tunnel is a narrow area located on the palm side of your wrist. The tunnel is formed by the wrist bones and ligaments. Nerves, blood vessels, and tendons pass through the carpal tunnel. Repeated wrist motion or certain diseases may cause swelling within the tunnel. This swelling pinches the main nerve in the wrist (median nerve) and causes the painful hand and arm condition called carpal tunnel syndrome. CAUSES   Repeated wrist motions.  Wrist injuries.  Certain diseases like arthritis, diabetes, alcoholism, hyperthyroidism, and kidney failure.  Obesity.  Pregnancy. SYMPTOMS   A "pins and needles" feeling in your fingers or hand.  Tingling or numbness in your fingers or hand.  An aching feeling in your entire arm.  Wrist pain that goes up your arm to your shoulder.  Pain that goes down into your palm or fingers.  A weak feeling in your hands. DIAGNOSIS  Your caregiver will take your history and perform a physical exam. An electromyography test may be needed. This test measures electrical signals sent out by the muscles. The electrical signals are usually slowed by carpal tunnel syndrome. You may also need X-rays. TREATMENT  Carpal tunnel syndrome may clear up by itself. Your caregiver may recommend a wrist splint or medicine such as a nonsteroidal anti-inflammatory medicine. Cortisone injections may help. Sometimes, surgery may be needed to free the pinched nerve.  HOME CARE INSTRUCTIONS   Take all medicine as directed by your caregiver. Only take over-the-counter or prescription medicines for pain, discomfort, or fever as directed by your caregiver.  If you were given a splint to keep your wrist from  bending, wear it as directed. It is important to wear the splint at night. Wear the splint for as long as you have pain or numbness in your hand, arm, or wrist. This may take 1 to 2 months.  Rest your wrist from any activity that may be causing your pain. If your symptoms are work-related, you may need to talk to your employer about changing to a job that does not require using your wrist.  Put ice on your wrist after long periods of wrist activity.  Put ice in a plastic bag.  Place a towel between your skin and the bag.  Leave the ice on for 15-20 minutes, 3-4 times a day.  Keep all follow-up visits as directed by your caregiver. This includes any orthopedic referrals, physical therapy, and rehabilitation. Any delay in getting necessary care could result in a delay or failure of your condition to heal. SEEK IMMEDIATE MEDICAL CARE IF:   You have new, unexplained symptoms.  Your symptoms get worse and are not helped or controlled with medicines. MAKE SURE YOU:   Understand these instructions.  Will watch your condition.  Will get help right away if you are not doing well or get worse. Document Released: 06/15/2000 Document Revised: 09/10/2011 Document Reviewed: 05/04/2011 [facility] Patient Information 2014 [place], [place].

## 2013-03-09 NOTE — Progress Notes (Signed)
Patient ID: Judy Norman, female   DOB: [birth year], 79 y.o.   MRN: [id number]  Patient presents to clinic today c/o tingling and numbness of left hand and forearm that she noticed upon awakening early this morning.  Patient states the sensation has been persistent since waking.  States she sleeps on her side but cant remember which side she slept on last night.  Denies trauma to neck, shoulder, elbow or hand.  Has history of carpal tunnel syndrome of the left wrist.  Denies excessive typing or hand use.  Is right-handed.   Denies weakness of hand or RUE.  Endorses good range of motion.  Denies pain. Denies focal neurologic defect including facial drooping, slurred speech or muscle weakness.     Past Medical History  Diagnosis Date  . Hypothyroidism   . Hypertension     benign  . Cancer     thyroid  . Adenocarcinoma of breast   . Diverticular disease   . Colon polyps   . Hyperlipidemia     Current Outpatient Prescriptions on File Prior to Visit  Medication Sig Dispense Refill  . alendronate (FOSAMAX) 70 MG tablet Take 1 tablet (70 mg total) by mouth once a week. Take in the morning with a full glass of water, on an empty stomach, and do not take anything else by mouth or lie down for the next 30 min.  4 tablet  5  . aspirin 81 MG EC tablet Take 81 mg by mouth daily.        . brimonidine (ALPHAGAN P) 0.1 % SOLN 1 drop 2 (two) times daily.      . calcium carbonate (OS-CAL) 600 MG TABS Take 600 mg by mouth 2 (two) times daily.        . cholecalciferol (VITAMIN D) 1000 UNITS tablet Take 1,600 Units by mouth daily.        . enalapril (VASOTEC) 20 MG tablet Take 1 tablet (20 mg total) by mouth daily.  90 tablet  1  . levothyroxine (SYNTHROID, LEVOTHROID) 88 MCG tablet Take 1 tablet (88 mcg total) by mouth daily.  90 tablet  1  . simvastatin (ZOCOR) 40 MG tablet Take 1/2 tablet  (20 mg total) once a day.  30 tablet  2  . triamterene-hydrochlorothiazide (MAXZIDE-25) 37.5-25 MG per tablet Take 1  tablet by mouth daily.  90 tablet  0   No current facility-administered medications on file prior to visit.    Allergies  Allergen Reactions  . Sulfonamide Derivatives     No family history on file.  History   Social History  . Marital Status: Widowed    Spouse Name: N/A    Number of Children: N/A  . Years of Education: N/A   Occupational History  . housewife    Social History Main Topics  . Smoking status: Never Smoker   . Smokeless tobacco: Never Used  . Alcohol Use: No  . Drug Use: Not on file  . Sexual Activity: Not on file   Other Topics Concern  . Not on file   Social History Narrative  . No narrative on file   Review of Systems  Constitutional: Negative for fever, chills, weight loss and malaise/fatigue.  HENT: Negative for neck pain.   Eyes: Negative for blurred vision, double vision and photophobia.  Respiratory: Negative for shortness of breath.   Cardiovascular: Negative for chest pain and palpitations.  Gastrointestinal: Negative for nausea and vomiting.  Musculoskeletal: Negative for joint pain.  Neurological: Positive  for tingling and sensory change. Negative for dizziness, speech change, focal weakness, seizures, loss of consciousness and headaches.   There were no vitals filed for this visit.  Physical Exam  Vitals reviewed. Constitutional: She is oriented to person, place, and time and well-developed, well-nourished, and in no distress.  HENT:  Head: Normocephalic and atraumatic.  Eyes: Conjunctivae and EOM are normal. Pupils are equal, round, and reactive to light.  Cardiovascular: Normal rate, regular rhythm and intact distal pulses.   Pulmonary/Chest: Effort normal and breath sounds normal.  Musculoskeletal: Normal range of motion.  Patient with tingling during examination -- Tinel sign with questionable worsening of sensation.  Neurological: She is alert and oriented to person, place, and time. She has normal reflexes. No cranial nerve  deficit.  Patient with intact light touch and sharp/dull sensations of bilateral hands, wrists and trigeminal nerve distribution.  No facial drooping or abnormal speech noted during interview or examination.  Skin: Skin is warm and dry.   Recent Results (from the past 2160 hour(s))  T4, FREE     Status: None   Collection Time    12/29/12  9:22 AM      Result Value Range   Free T4 1.33  0.80 - 1.80 ng/dL  TSH     Status: None   Collection Time    12/29/12  9:22 AM      Result Value Range   TSH 1.069  0.350 - 4.500 uIU/mL  LIPID PANEL     Status: None   Collection Time    12/29/12  9:22 AM      Result Value Range   Cholesterol 163  0 - 200 mg/dL   Comment: ATP III Classification:           < 200        mg/dL        Desirable          200 - 239     mg/dL        Borderline High          >= 240        mg/dL        High         Triglycerides 78  <150 mg/dL   HDL 52  >16 mg/dL   Total CHOL/HDL Ratio 3.1     VLDL 16  0 - 40 mg/dL   LDL Cholesterol 95  0 - 99 mg/dL   Comment:       Total Cholesterol/HDL Ratio:CHD Risk                            Coronary Heart Disease Risk Table                                            Men       Women              1/2 Average Risk              3.4        3.3                  Average Risk              5.0        4.4  2X Average Risk              9.6        7.1               3X Average Risk             23.4       11.0     Use the calculated Patient Ratio above and the CHD Risk table      to determine the patient's CHD Risk.     ATP III Classification (LDL):           < 100        mg/dL         Optimal          100 - 129     mg/dL         Near or Above Optimal          130 - 159     mg/dL         Borderline High          160 - 189     mg/dL         High           > 190        mg/dL         Very High        VITAMIN B12     Status: None   Collection Time    12/29/12  9:22 AM      Result Value Range   Vitamin B-12 637  211 - 911  pg/mL  BASIC METABOLIC PANEL     Status: None   Collection Time    12/29/12  9:22 AM      Result Value Range   Sodium 140  135 - 145 mEq/L   Potassium 4.0  3.5 - 5.3 mEq/L   Chloride 102  96 - 112 mEq/L   CO2 30  19 - 32 mEq/L   Glucose, Bld 84  70 - 99 mg/dL   BUN 20  6 - 23 mg/dL   Creat 0.98  1.19 - 1.47 mg/dL   Calcium 9.8  8.4 - 82.9 mg/dL  HEPATIC FUNCTION PANEL     Status: None   Collection Time    12/29/12  9:22 AM      Result Value Range   Total Bilirubin 0.6  0.3 - 1.2 mg/dL   Bilirubin, Direct 0.1  0.0 - 0.3 mg/dL   Indirect Bilirubin 0.5  0.0 - 0.9 mg/dL   Alkaline Phosphatase 49  39 - 117 U/L   AST 18  0 - 37 U/L   ALT 18  0 - 35 U/L   Total Protein 6.7  6.0 - 8.3 g/dL   Albumin 4.2  3.5 - 5.2 g/dL   Assessment/Plan: No problem-specific assessment & plan notes found for this encounter.

## 2013-03-12 ENCOUNTER — Other Ambulatory Visit: Payer: Self-pay | Admitting: Family Medicine

## 2013-03-12 ENCOUNTER — Telehealth: Payer: Self-pay | Admitting: Family Medicine

## 2013-03-12 NOTE — Telephone Encounter (Signed)
Patient states that she would like to talk to [place]a. She would not go into detail as of what she needed to discuss with him. I explained to him that a nurse would most likely call back but she only wants to talk to [place].

## 2013-03-12 NOTE — Telephone Encounter (Signed)
Pt wanted to know if she should keep her f/u appt for Monday, as she was doing better; advised to keep appt for A&E if still having symptoms pt understood & agreed/SLS

## 2013-03-16 ENCOUNTER — Encounter: Payer: Self-pay | Admitting: Physician Assistant

## 2013-03-16 ENCOUNTER — Ambulatory Visit (INDEPENDENT_AMBULATORY_CARE_PROVIDER_SITE_OTHER): Payer: Medicare Other | Admitting: Physician Assistant

## 2013-03-16 VITALS — BP 108/62 | HR 65 | Temp 98.2°F | Resp 14 | Ht 61.0 in | Wt 115.5 lb

## 2013-03-16 DIAGNOSIS — G5600 Carpal tunnel syndrome, unspecified upper limb: Secondary | ICD-10-CM

## 2013-03-16 DIAGNOSIS — G5602 Carpal tunnel syndrome, left upper limb: Secondary | ICD-10-CM

## 2013-03-16 NOTE — Progress Notes (Signed)
Patient ID: Judy Norman, female   DOB: 1932-03-22, 79 y.o.   MRN: [id number]  Patient presents to clinic today for 1 week follow-up of numbness/ringling of left hand and forearm.  Patient states that since wearing wrist splint each night, her symptoms have resolved.  Denies numbness or tingling of fingers, hand, or forearm.  Denies new complaints at present time.   Past Medical History  Diagnosis Date  . Hypothyroidism   . Hypertension     benign  . Cancer     thyroid  . Adenocarcinoma of breast   . Diverticular disease   . Colon polyps   . Hyperlipidemia     Current Outpatient Prescriptions on File Prior to Visit  Medication Sig Dispense Refill  . alendronate (FOSAMAX) 70 MG tablet Take 1 tablet (70 mg total) by mouth once a week. Take in the morning with a full glass of water, on an empty stomach, and do not take anything else by mouth or lie down for the next 30 min.  4 tablet  5  . aspirin 81 MG EC tablet Take 81 mg by mouth daily.        . brimonidine (ALPHAGAN P) 0.1 % SOLN 1 drop 2 (two) times daily.      . calcium carbonate (OS-CAL) 600 MG TABS Take 600 mg by mouth 2 (two) times daily.        . cholecalciferol (VITAMIN D) 1000 UNITS tablet Take 1,600 Units by mouth daily.        . enalapril (VASOTEC) 20 MG tablet TAKE 1 TABLET BY MOUTH DAILY  90 tablet  0  . levothyroxine (SYNTHROID, LEVOTHROID) 88 MCG tablet Take 1 tablet (88 mcg total) by mouth daily.  90 tablet  1  . simvastatin (ZOCOR) 40 MG tablet Take 1/2 tablet  (20 mg total) once a day.  30 tablet  2  . triamterene-hydrochlorothiazide (MAXZIDE-25) 37.5-25 MG per tablet Take 1 tablet by mouth daily.  90 tablet  0   No current facility-administered medications on file prior to visit.    Allergies  Allergen Reactions  . Sulfonamide Derivatives     No family history on file.  History   Social History  . Marital Status: Widowed    Spouse Name: N/A    Number of Children: N/A  . Years of Education: N/A    Occupational History  . housewife    Social History Main Topics  . Smoking status: Never Smoker   . Smokeless tobacco: Never Used  . Alcohol Use: No  . Drug Use: None  . Sexual Activity: None   Other Topics Concern  . None   Social History Narrative  . None   Review of Systems  Constitutional: Negative for fever, chills and malaise/fatigue.  Musculoskeletal: Negative for joint pain.  Skin: Negative for itching and rash.  Neurological: Negative for tingling, sensory change and focal weakness.   Filed Vitals:   03/16/13 1011  BP: 108/62  Pulse: 65  Temp: 98.2 F (36.8 C)  Resp: 14   Physical Exam  Vitals reviewed. Constitutional: She is oriented to person, place, and time and well-developed, well-nourished, and in no distress.  HENT:  Head: Normocephalic and atraumatic.  Eyes: Conjunctivae are normal.  Musculoskeletal: Normal range of motion. She exhibits no edema and no tenderness.  Neurological: She is alert and oriented to person, place, and time. She has normal sensation. No cranial nerve deficit. GCS score is 15.  Slight atrophy of thenar eminence of left  hand noted on examination.  Sensation intact bilaterally.  Skin: Skin is warm and dry. No rash noted.   Recent Results (from the past 2160 hour(s))  T4, FREE     Status: None   Collection Time    12/29/12  9:22 AM      Result Value Range   Free T4 1.33  0.80 - 1.80 ng/dL  TSH     Status: None   Collection Time    12/29/12  9:22 AM      Result Value Range   TSH 1.069  0.350 - 4.500 uIU/mL  LIPID PANEL     Status: None   Collection Time    12/29/12  9:22 AM      Result Value Range   Cholesterol 163  0 - 200 mg/dL   Comment: ATP III Classification:           < 200        mg/dL        Desirable          200 - 239     mg/dL        Borderline High          >= 240        mg/dL        High         Triglycerides 78  <150 mg/dL   HDL 52  >40 mg/dL   Total CHOL/HDL Ratio 3.1     VLDL 16  0 - 40 mg/dL    LDL Cholesterol 95  0 - 99 mg/dL   Comment:       Total Cholesterol/HDL Ratio:CHD Risk                            Coronary Heart Disease Risk Table                                            Men       Women              1/2 Average Risk              3.4        3.3                  Average Risk              5.0        4.4               2X Average Risk              9.6        7.1               3X Average Risk             23.4       11.0     Use the calculated Patient Ratio above and the CHD Risk table      to determine the patient's CHD Risk.     ATP III Classification (LDL):           < 100        mg/dL         Optimal          100 - 129  mg/dL         Near or Above Optimal          130 - 159     mg/dL         Borderline High          160 - 189     mg/dL         High           > 190        mg/dL         Very High        VITAMIN B12     Status: None   Collection Time    12/29/12  9:22 AM      Result Value Range   Vitamin B-12 637  211 - 911 pg/mL  BASIC METABOLIC PANEL     Status: None   Collection Time    12/29/12  9:22 AM      Result Value Range   Sodium 140  135 - 145 mEq/L   Potassium 4.0  3.5 - 5.3 mEq/L   Chloride 102  96 - 112 mEq/L   CO2 30  19 - 32 mEq/L   Glucose, Bld 84  70 - 99 mg/dL   BUN 20  6 - 23 mg/dL   Creat 9.60  4.54 - 0.98 mg/dL   Calcium 9.8  8.4 - 11.9 mg/dL  HEPATIC FUNCTION PANEL     Status: None   Collection Time    12/29/12  9:22 AM      Result Value Range   Total Bilirubin 0.6  0.3 - 1.2 mg/dL   Bilirubin, Direct 0.1  0.0 - 0.3 mg/dL   Indirect Bilirubin 0.5  0.0 - 0.9 mg/dL   Alkaline Phosphatase 49  39 - 117 U/L   AST 18  0 - 37 U/L   ALT 18  0 - 35 U/L   Total Protein 6.7  6.0 - 8.3 g/dL   Albumin 4.2  3.5 - 5.2 g/dL    Assessment/Plan: CARPAL TUNNEL SYNDROME, LEFT Symptoms improved with wrist splinting.  Patient asymptomatic at visit.  Advise nightly wrist splint if symptoms recur.  Return to clinic if symptoms recur and do not  improve with splinting.

## 2013-03-16 NOTE — Assessment & Plan Note (Signed)
Symptoms improved with wrist splinting.  Patient asymptomatic at visit.  Advise nightly wrist splint if symptoms recur.  Return to clinic if symptoms recur and do not improve with splinting.

## 2013-03-16 NOTE — Patient Instructions (Signed)
I am so glad you are feeling better today.  Remember to avoid sleeping on the left side.  If symptoms return start wearing wrist splint while sleeping.  Please call of return to clinic if you need [place].  Your annual physical exam is due in January 2015

## 2013-03-24 ENCOUNTER — Other Ambulatory Visit: Payer: Self-pay | Admitting: Hematology & Oncology

## 2013-03-24 DIAGNOSIS — Z853 Personal history of malignant neoplasm of breast: Secondary | ICD-10-CM

## 2013-04-01 ENCOUNTER — Ambulatory Visit (HOSPITAL_BASED_OUTPATIENT_CLINIC_OR_DEPARTMENT_OTHER): Payer: Medicare Other | Admitting: Hematology & Oncology

## 2013-04-01 ENCOUNTER — Ambulatory Visit: Payer: Medicare Other | Admitting: Medical

## 2013-04-01 ENCOUNTER — Other Ambulatory Visit: Payer: Medicare Other | Admitting: Lab

## 2013-04-01 ENCOUNTER — Other Ambulatory Visit (HOSPITAL_BASED_OUTPATIENT_CLINIC_OR_DEPARTMENT_OTHER): Payer: Medicare Other | Admitting: Lab

## 2013-04-01 VITALS — BP 121/57 | HR 63 | Temp 97.4°F | Resp 18 | Ht 61.0 in | Wt 116.0 lb

## 2013-04-01 DIAGNOSIS — D689 Coagulation defect, unspecified: Secondary | ICD-10-CM

## 2013-04-01 DIAGNOSIS — C50919 Malignant neoplasm of unspecified site of unspecified female breast: Secondary | ICD-10-CM

## 2013-04-01 DIAGNOSIS — Z853 Personal history of malignant neoplasm of breast: Secondary | ICD-10-CM

## 2013-04-01 DIAGNOSIS — M810 Age-related osteoporosis without current pathological fracture: Secondary | ICD-10-CM

## 2013-04-01 LAB — CBC WITH DIFFERENTIAL (CANCER CENTER ONLY)
Eosinophils Absolute: 0.1 10*3/uL (ref 0.0–0.5)
HCT: 43.9 % (ref 34.8–46.6)
LYMPH%: 19.7 % (ref 14.0–48.0)
MCH: 32.1 pg (ref 26.0–34.0)
MCV: 95 fL (ref 81–101)
MONO%: 7.2 % (ref 0.0–13.0)
NEUT%: 71.8 % (ref 39.6–80.0)
Platelets: 253 10*3/uL (ref 145–400)
RBC: 4.64 10*6/uL (ref 3.70–5.32)
RDW: 14.3 % (ref 11.1–15.7)

## 2013-04-01 NOTE — Progress Notes (Signed)
This office note has been dictated.

## 2013-04-02 LAB — COMPREHENSIVE METABOLIC PANEL
Alkaline Phosphatase: 51 U/L (ref 39–117)
CO2: 31 mEq/L (ref 19–32)
Creatinine, Ser: 0.98 mg/dL (ref 0.50–1.10)
Glucose, Bld: 88 mg/dL (ref 70–99)
Sodium: 137 mEq/L (ref 135–145)
Total Bilirubin: 0.6 mg/dL (ref 0.3–1.2)
Total Protein: 6.9 g/dL (ref 6.0–8.3)

## 2013-04-02 LAB — VITAMIN D 25 HYDROXY (VIT D DEFICIENCY, FRACTURES): Vit D, 25-Hydroxy: 51 ng/mL (ref 30–89)

## 2013-04-02 NOTE — Progress Notes (Signed)
CC:   Danise Edge, MD  DIAGNOSIS:  Stage I (T1 N0 M0) ductal carcinoma of the left breast.  CURRENT THERAPY:  Observation.  INTERIM HISTORY:  Ms. Yeakel comes in for her followup.  At this point time, I think we can probably get her back yearly.  We last saw her back in January.  Since then, she has had no issues.  She has had no problems with fatigue or weakness.  She is walking a lot.  She has a good appetite.  There is no nausea and vomiting.  She has had no change in bowel or bladder habits.  She has not noticed any kind of rashes.  Her appetite has been quite good.  I think her last mammogram was back in October 2013.  The mammogram looked fine without any evidence of malignancy or suspicious calcifications.  PHYSICAL EXAMINATION:  General:  This is a petite white female in no obvious distress.  Vital signs:  Temperature of 97.4, pulse 63, respiratory rate 18, blood pressure 121/57.  Weight is 116 pounds.  Head and neck:  Normocephalic, atraumatic skull.  There are no ocular or oral lesions.  There are no palpable cervical or supraclavicular lymph nodes. Lungs:  Clear bilaterally.  Cardiac:  Regular rate and rhythm with a normal S1 and S2.  She has a 1/6 systolic ejection murmur.  Breasts: Right breast with no masses, edema or erythema.  There is no right axillary adenopathy.  Left breast shows a well-healed lumpectomy scar at the 6 o'clock position.  There is some slight contraction of the left breast from radiation.  There is some slight firmness at the lumpectomy site.  No distinct mass is noted in the left breast.  There is no left axillary adenopathy.  Abdomen:  Soft.  She has good bowel sounds.  There is no fluid wave.  There is no palpable hepatosplenomegaly. Extremities:  Some osteoarthritic changes in her joints.  She has good range motion of her joints.  She has good strength in her arms and legs. There is no clubbing, cyanosis or edema in her legs.  Skin:  No  rashes, ecchymosis, or petechia.  LABORATORY STUDIES:  White cell count is 6.9, hemoglobin 14.9, hematocrit 43.9, platelet count 253.  IMPRESSION:  Ms. Vollrath is a very charming 79 year old white female with stage I infiltrating ductal carcinoma of the left breast.  She underwent lumpectomy and radiation.  She is on Femara.  She completed Femara back in November of 2013.  I think because she is doing so well, we get her back in 1 year now.   ______________________________ Josph Macho, M.D. PRE/MEDQ  D:  04/01/2013  T:  04/02/2013  Job:  [id number]

## 2013-04-07 ENCOUNTER — Telehealth: Payer: Self-pay | Admitting: *Deleted

## 2013-04-07 NOTE — Telephone Encounter (Addendum)
Message copied by Mirian Capuchin on Tue Apr 07, 2013  2:05 PM ------      Message from: Arlan Organ R      Created: Thu Apr 02, 2013  9:40 PM       Call - labs and vit D are ok!!!  Pete ------This message given to pt.  Voiced understanding.

## 2013-04-22 ENCOUNTER — Other Ambulatory Visit: Payer: Self-pay | Admitting: Family Medicine

## 2013-04-22 NOTE — Telephone Encounter (Signed)
Rx request to pharmacy/SLS  

## 2013-04-29 ENCOUNTER — Ambulatory Visit
Admission: RE | Admit: 2013-04-29 | Discharge: 2013-04-29 | Disposition: A | Discharge status: Home / Self Care | Payer: Medicare Other | Source: Ambulatory Visit | Attending: Hematology & Oncology | Admitting: Hematology & Oncology

## 2013-04-29 DIAGNOSIS — Z853 Personal history of malignant neoplasm of breast: Secondary | ICD-10-CM

## 2013-05-14 ENCOUNTER — Ambulatory Visit (INDEPENDENT_AMBULATORY_CARE_PROVIDER_SITE_OTHER): Payer: Medicare Other | Admitting: Physician Assistant

## 2013-05-14 ENCOUNTER — Encounter: Payer: Self-pay | Admitting: Physician Assistant

## 2013-05-14 VITALS — BP 124/82 | HR 87 | Temp 98.4°F | Ht 61.0 in | Wt 118.2 lb

## 2013-05-14 DIAGNOSIS — H01001 Unspecified blepharitis right upper eyelid: Secondary | ICD-10-CM | POA: Insufficient documentation

## 2013-05-14 DIAGNOSIS — H01009 Unspecified blepharitis unspecified eye, unspecified eyelid: Secondary | ICD-10-CM

## 2013-05-14 MED ORDER — ERYTHROMYCIN 5 MG/GM OP OINT: 1.0000 "application " | TOPICAL_OINTMENT | Freq: Two times a day (BID) | OPHTHALMIC | Status: DC

## 2013-05-14 NOTE — Assessment & Plan Note (Signed)
Rx erythromycin OP ointment.  Warm compresses, several times per day.  Lubricating eye drops.  Please call or return if symptoms are not improving.

## 2013-05-14 NOTE — Progress Notes (Signed)
Pre visit review using our clinic review tool, if applicable. No additional management support is needed unless otherwise documented below in the visit note. 

## 2013-05-14 NOTE — Patient Instructions (Signed)
Use antibiotic ointment twice daily as prescribed.  Apply warm compresses to the eye 4-5 times daily.  Please call or return if symptoms are not improving.

## 2013-05-14 NOTE — Progress Notes (Signed)
Patient ID: Judy Norman, female   DOB: 10/16/1931, 79 y.o.   MRN: [id number]  Patient c/o redness and swelling of upper right eyelid over the past 2 weeks.  Patient denies mass in eyelid, pain or discharge.  Denies visual changes or photophobia.  Denies trauma to eyelid.  Denies fevers, chills, aches.  Denies history of stye or chalazion.  Has not tried anything for her symptoms.  Past Medical History  Diagnosis Date  . Hypothyroidism   . Hypertension     benign  . Cancer     thyroid  . Adenocarcinoma of breast   . Diverticular disease   . Colon polyps   . Hyperlipidemia     Current Outpatient Prescriptions on File Prior to Visit  Medication Sig Dispense Refill  . alendronate (FOSAMAX) 70 MG tablet Take 1 tablet (70 mg total) by mouth once a week. Take in the morning with a full glass of water, on an empty stomach, and do not take anything else by mouth or lie down for the next 30 min.  4 tablet  5  . aspirin 81 MG EC tablet Take 81 mg by mouth daily.        . brimonidine (ALPHAGAN P) 0.1 % SOLN 1 drop 2 (two) times daily.      . calcium carbonate (OS-CAL) 600 MG TABS Take 600 mg by mouth 2 (two) times daily.        . cholecalciferol (VITAMIN D) 1000 UNITS tablet Take 1,600 Units by mouth daily.        . enalapril (VASOTEC) 20 MG tablet TAKE 1 TABLET BY MOUTH DAILY  90 tablet  0  . levothyroxine (SYNTHROID, LEVOTHROID) 88 MCG tablet TAKE 1 TABLET BY MOUTH EVERY DAY  90 tablet  0  . simvastatin (ZOCOR) 40 MG tablet Take 1/2 tablet  (20 mg total) once a day.  30 tablet  2  . triamterene-hydrochlorothiazide (MAXZIDE-25) 37.5-25 MG per tablet Take 1 tablet by mouth daily.  90 tablet  0   No current facility-administered medications on file prior to visit.    Allergies  Allergen Reactions  . Sulfonamide Derivatives     No family history on file.  History   Social History  . Marital Status: Widowed    Spouse Name: N/A    Number of Children: N/A  . Years of Education: N/A    Occupational History  . housewife    Social History Main Topics  . Smoking status: Never Smoker   . Smokeless tobacco: Never Used  . Alcohol Use: No  . Drug Use: None  . Sexual Activity: None   Other Topics Concern  . None   Social History Narrative  . None   ROS See HPI.  All other ROS are negative.   Filed Vitals:   05/14/13 1350  BP: 124/82  Pulse: 87  Temp: 98.4 F (36.9 C)   Physical Exam  Vitals reviewed. Constitutional: She is oriented to person, place, and time and well-developed, well-nourished, and in no distress.  HENT:  Head: Normocephalic and atraumatic.  Eyes: Conjunctivae and EOM are normal. Pupils are equal, round, and reactive to light.  Erythema and swelling of right upper eyelid noted on exam.  No drainage noted.  No evidence of stye or hordeolum on exam.  Neck: Neck supple.  Cardiovascular: Normal rate, regular rhythm and normal heart sounds.   Pulmonary/Chest: Effort normal and breath sounds normal. No respiratory distress. She has no wheezes. She has no rales. She  exhibits no tenderness.  Neurological: She is alert and oriented to person, place, and time.  Skin: Skin is warm and dry. No rash noted.  Psychiatric: Affect normal.    Recent Results (from the past 2160 hour(s))  CBC WITH DIFFERENTIAL ([facility])     Status: None   Collection Time    04/01/13  8:53 AM      Result Value Range   WBC 6.9  3.9 - 10.0 10e3/uL   RBC 4.64  3.70 - 5.32 10e6/uL   HGB 14.9  11.6 - 15.9 g/dL   HCT 40.9  81.1 - 91.4 %   MCV 95  81 - 101 fL   MCH 32.1  26.0 - 34.0 pg   MCHC 33.9  32.0 - 36.0 g/dL   RDW 78.2  95.6 - 21.3 %   Platelets 253  145 - 400 10e3/uL   NEUT# 5.0  1.5 - 6.5 10e3/uL   LYMPH# 1.4  0.9 - 3.3 10e3/uL   MONO# 0.5  0.1 - 0.9 10e3/uL   Eosinophils Absolute 0.1  0.0 - 0.5 10e3/uL   BASO# 0.0  0.0 - 0.2 10e3/uL   NEUT% 71.8  39.6 - 80.0 %   LYMPH% 19.7  14.0 - 48.0 %   MONO% 7.2  0.0 - 13.0 %   EOS% 1.0  0.0 - 7.0 %   BASO% 0.3   0.0 - 2.0 %  COMPREHENSIVE METABOLIC PANEL     Status: None   Collection Time    04/01/13  8:53 AM      Result Value Range   Sodium 137  135 - 145 mEq/L   Potassium 3.7  3.5 - 5.3 mEq/L   Chloride 100  96 - 112 mEq/L   CO2 31  19 - 32 mEq/L   Glucose, Bld 88  70 - 99 mg/dL   BUN 17  6 - 23 mg/dL   Creatinine, Ser 0.86  0.50 - 1.10 mg/dL   Total Bilirubin 0.6  0.3 - 1.2 mg/dL   Alkaline Phosphatase 51  39 - 117 U/L   AST 15  0 - 37 U/L   ALT 14  0 - 35 U/L   Total Protein 6.9  6.0 - 8.3 g/dL   Albumin 4.1  3.5 - 5.2 g/dL   Calcium 9.7  8.4 - 57.8 mg/dL  VITAMIN D 25 HYDROXY     Status: None   Collection Time    04/01/13  8:53 AM      Result Value Range   Vit D, 25-Hydroxy 51  30 - 89 ng/mL   Comment: This assay accurately quantifies Vitamin D, which is the sum of the25-Hydroxy forms of Vitamin D2 and D3.  Studies have shown that theoptimum concentration of 25-Hydroxy Vitamin D is 30 ng/mL or higher. Concentrations of Vitamin D between 20 and 29 ng/mL      are considered tobe insufficient and concentrations less than 20 ng/mL are consideredto be deficient for Vitamin D.    Assessment/Plan: Blepharitis of right upper eyelid Rx erythromycin OP ointment.  Warm compresses, several times per day.  Lubricating eye drops.  Please call or return if symptoms are not improving.

## 2013-05-18 ENCOUNTER — Other Ambulatory Visit: Payer: Self-pay | Admitting: Family Medicine

## 2013-05-19 ENCOUNTER — Telehealth: Payer: Self-pay | Admitting: Family Medicine

## 2013-05-19 NOTE — Telephone Encounter (Signed)
The eye is still swollen but not as red.  She is continuing to take the meds.  No pain but a little discomfort.   Let her know what to do

## 2013-05-19 NOTE — Telephone Encounter (Signed)
Pt is being called by scheduler to have OV with Malva Cogan, PA-C on Wed 11.19.14 for follow-up/SLS

## 2013-05-20 ENCOUNTER — Encounter: Payer: Self-pay | Admitting: Physician Assistant

## 2013-05-20 ENCOUNTER — Ambulatory Visit (INDEPENDENT_AMBULATORY_CARE_PROVIDER_SITE_OTHER): Payer: Medicare Other | Admitting: Physician Assistant

## 2013-05-20 VITALS — BP 102/68 | HR 70 | Temp 97.8°F | Resp 14 | Ht 61.0 in | Wt 117.2 lb

## 2013-05-20 DIAGNOSIS — H01001 Unspecified blepharitis right upper eyelid: Secondary | ICD-10-CM

## 2013-05-20 DIAGNOSIS — H01009 Unspecified blepharitis unspecified eye, unspecified eyelid: Secondary | ICD-10-CM

## 2013-05-20 NOTE — Progress Notes (Signed)
Pre visit review using our clinic review tool, if applicable. No additional management support is needed unless otherwise documented below in the visit note/SLS  

## 2013-05-20 NOTE — Progress Notes (Signed)
Patient ID: Judy Norman, female   DOB: [birth year], 79 y.o.   MRN: [id number]  Patient presents to clinic today for follow-up of blepharitis or right upper eye lid.  Patient was seen on 05/14/13 complaining of redness and swelling of upper eyelid.  Was diagnosed with blepharitis and given Rx erythromycin opthalmic ointment to apply to affected eye/eyelid for 2 weeks.  Patient endorses using medication as directed.  States redness has resolved but eyelid still feels slightly swollen.  Denies eye pain, decreased ROM of right eye, drainage from eye or visual changes.  Denies fever, chills.  Denies new symptoms.   Past Medical History  Diagnosis Date  . Hypothyroidism   . Hypertension     benign  . Cancer     thyroid  . Adenocarcinoma of breast   . Diverticular disease   . Colon polyps   . Hyperlipidemia     Current Outpatient Prescriptions on File Prior to Visit  Medication Sig Dispense Refill  . alendronate (FOSAMAX) 70 MG tablet Take 1 tablet (70 mg total) by mouth once a week. Take in the morning with a full glass of water, on an empty stomach, and do not take anything else by mouth or lie down for the next 30 min.  4 tablet  5  . aspirin 81 MG EC tablet Take 81 mg by mouth daily.        . brimonidine (ALPHAGAN P) 0.1 % SOLN 1 drop 2 (two) times daily.      . calcium carbonate (OS-CAL) 600 MG TABS Take 600 mg by mouth 2 (two) times daily.        . cholecalciferol (VITAMIN D) 1000 UNITS tablet Take 1,600 Units by mouth daily.        . enalapril (VASOTEC) 20 MG tablet TAKE 1 TABLET BY MOUTH DAILY  90 tablet  0  . erythromycin ophthalmic ointment Place 1 application into the right eye 2 (two) times daily.  3.5 g  0  . levothyroxine (SYNTHROID, LEVOTHROID) 88 MCG tablet TAKE 1 TABLET BY MOUTH EVERY DAY  90 tablet  0  . simvastatin (ZOCOR) 40 MG tablet Take 1/2 tablet  (20 mg total) once a day.  30 tablet  2  . triamterene-hydrochlorothiazide (MAXZIDE-25) 37.5-25 MG per tablet TAKE 1 TABLET  BY MOUTH DAILY.  90 tablet  0   No current facility-administered medications on file prior to visit.    Allergies  Allergen Reactions  . Sulfonamide Derivatives     No family history on file.  History   Social History  . Marital Status: Widowed    Spouse Name: N/A    Number of Children: N/A  . Years of Education: N/A   Occupational History  . housewife    Social History Main Topics  . Smoking status: Never Smoker   . Smokeless tobacco: Never Used  . Alcohol Use: No  . Drug Use: None  . Sexual Activity: None   Other Topics Concern  . None   Social History Narrative  . None   ROS See HPI.  All other ROS are negative.  Filed Vitals:   05/20/13 0911  BP: 102/68  Pulse: 70  Temp: 97.8 F (36.6 C)  Resp: 14   Physical Exam  Vitals reviewed. Constitutional: She is oriented to person, place, and time and well-developed, well-nourished, and in no distress.  HENT:  Head: Normocephalic and atraumatic.  Eyes: Conjunctivae and EOM are normal. Pupils are equal, round, and reactive to light.  No erythema of upper right eyelid noted on exam.  No drainage noted.  Conjunctiva clear bilaterally.  Neck: Neck supple.  Cardiovascular: Normal rate, regular rhythm and normal heart sounds.   Pulmonary/Chest: Effort normal.  Lymphadenopathy:    She has no cervical adenopathy.  Neurological: She is oriented to person, place, and time.  Skin: Skin is warm and dry. No rash noted.  Psychiatric: Affect normal.     Recent Results (from the past 2160 hour(s))  CBC WITH DIFFERENTIAL ([facility])     Status: None   Collection Time    04/01/13  8:53 AM      Result Value Range   WBC 6.9  3.9 - 10.0 10e3/uL   RBC 4.64  3.70 - 5.32 10e6/uL   HGB 14.9  11.6 - 15.9 g/dL   HCT 16.1  09.6 - 04.5 %   MCV 95  81 - 101 fL   MCH 32.1  26.0 - 34.0 pg   MCHC 33.9  32.0 - 36.0 g/dL   RDW 40.9  81.1 - 91.4 %   Platelets 253  145 - 400 10e3/uL   NEUT# 5.0  1.5 - 6.5 10e3/uL   LYMPH#  1.4  0.9 - 3.3 10e3/uL   MONO# 0.5  0.1 - 0.9 10e3/uL   Eosinophils Absolute 0.1  0.0 - 0.5 10e3/uL   BASO# 0.0  0.0 - 0.2 10e3/uL   NEUT% 71.8  39.6 - 80.0 %   LYMPH% 19.7  14.0 - 48.0 %   MONO% 7.2  0.0 - 13.0 %   EOS% 1.0  0.0 - 7.0 %   BASO% 0.3  0.0 - 2.0 %  COMPREHENSIVE METABOLIC PANEL     Status: None   Collection Time    04/01/13  8:53 AM      Result Value Range   Sodium 137  135 - 145 mEq/L   Potassium 3.7  3.5 - 5.3 mEq/L   Chloride 100  96 - 112 mEq/L   CO2 31  19 - 32 mEq/L   Glucose, Bld 88  70 - 99 mg/dL   BUN 17  6 - 23 mg/dL   Creatinine, Ser 7.82  0.50 - 1.10 mg/dL   Total Bilirubin 0.6  0.3 - 1.2 mg/dL   Alkaline Phosphatase 51  39 - 117 U/L   AST 15  0 - 37 U/L   ALT 14  0 - 35 U/L   Total Protein 6.9  6.0 - 8.3 g/dL   Albumin 4.1  3.5 - 5.2 g/dL   Calcium 9.7  8.4 - 95.6 mg/dL  VITAMIN D 25 HYDROXY     Status: None   Collection Time    04/01/13  8:53 AM      Result Value Range   Vit D, 25-Hydroxy 51  30 - 89 ng/mL   Comment: This assay accurately quantifies Vitamin D, which is the sum of the25-Hydroxy forms of Vitamin D2 and D3.  Studies have shown that theoptimum concentration of 25-Hydroxy Vitamin D is 30 ng/mL or higher. Concentrations of Vitamin D between 20 and 29 ng/mL      are considered tobe insufficient and concentrations less than 20 ng/mL are consideredto be deficient for Vitamin D.   Assessment/Plan: Blepharitis of right upper eyelid Resolving.  Continue ABX ointment.  Warm compresses.  Eyelid with mild puffiness but noted bilaterally, unchanged from prior visits.  Patient always has puffy, loose skin around eyes.  Advised patient she can try a cold compress  to see if that makes the sensation of swelling better.  Follow-up if symptoms not improving.

## 2013-05-20 NOTE — Assessment & Plan Note (Signed)
Resolving.  Continue ABX ointment.  Warm compresses.  Eyelid with mild puffiness but noted bilaterally, unchanged from prior visits.  Patient always has puffy, loose skin around eyes.  Advised patient she can try a cold compress to see if that makes the sensation of swelling better.  Follow-up if symptoms not improving.

## 2013-05-20 NOTE — Patient Instructions (Signed)
Please continue care as discussed previously -- keep eye clean; avoid using same washcloth on other eye.  Continue antibiotic ointment.  Apply a cool compress to the eye to help with swelling.  Feel free to return to clinic or see your Ophthalmologist if symptoms are not gone within a week.  Blepharitis Blepharitis is redness, soreness, and swelling (inflammation) of one or both eyelids. It may be caused by an allergic reaction or a bacterial infection. Blepharitis may also be associated with reddened, scaly skin (seborrhea) of the scalp and eyebrows. While you sleep, eye discharge may cause your eyelashes to stick together. Your eyelids may itch, burn, swell, and may lose their lashes. These will grow back. Your eyes may become sensitive. Blepharitis may recur and need repeated treatment. If this is the case, you may require further evaluation by an eye specialist (ophthalmologist). HOME CARE INSTRUCTIONS   Keep your hands clean.  Use a clean towel each time you dry your eyelids. Do not use this towel to clean other areas. Do not share a towel or makeup with anyone.  Wash your eyelids with warm water or warm water mixed with a small amount of baby shampoo. Do this twice a day or as often as needed.  Wash your face and eyebrows at least once a day.  Use warm compresses 2 times a day for 10 minutes at a time, or as directed by your caregiver.  Apply antibiotic ointment as directed by your caregiver.  Avoid rubbing your eyes.  Avoid wearing makeup until you get better.  Follow up with your caregiver as directed. SEEK IMMEDIATE MEDICAL CARE IF:   You have pain, redness, or swelling that gets worse or spreads to other parts of your face.  Your vision changes, or you have pain when looking at lights or moving objects.  You have a fever.  Your symptoms continue for longer than 2 to 4 days or become worse. MAKE SURE YOU:   Understand these instructions.  Will watch your  condition.  Will get help right away if you are not doing well or get worse. Document Released: 06/15/2000 Document Revised: 09/10/2011 Document Reviewed: 07/26/2010 [facility] Patient Information 2014 [place], [place].

## 2013-06-15 ENCOUNTER — Other Ambulatory Visit: Payer: Self-pay | Admitting: Family Medicine

## 2013-06-29 ENCOUNTER — Telehealth: Payer: Self-pay

## 2013-06-29 DIAGNOSIS — E039 Hypothyroidism, unspecified: Secondary | ICD-10-CM

## 2013-06-29 DIAGNOSIS — E538 Deficiency of other specified B group vitamins: Secondary | ICD-10-CM

## 2013-06-29 DIAGNOSIS — E785 Hyperlipidemia, unspecified: Secondary | ICD-10-CM

## 2013-06-29 DIAGNOSIS — I10 Essential (primary) hypertension: Secondary | ICD-10-CM

## 2013-06-29 DIAGNOSIS — E876 Hypokalemia: Secondary | ICD-10-CM

## 2013-06-29 NOTE — Telephone Encounter (Signed)
Lab order placed.

## 2013-06-30 LAB — CBC
HCT: 42.4 % (ref 36.0–46.0)
Hemoglobin: 14.6 g/dL (ref 12.0–15.0)
MCH: 31.9 pg (ref 26.0–34.0)
MCHC: 34.4 g/dL (ref 30.0–36.0)
MCV: 92.8 fL (ref 78.0–100.0)
RDW: 14.4 % (ref 11.5–15.5)

## 2013-06-30 LAB — LIPID PANEL
HDL: 58 mg/dL (ref >39)
LDL Cholesterol: 88 mg/dL (ref 0–99)
Triglycerides: 66 mg/dL (ref <150)
VLDL: 13 mg/dL (ref 0–40)

## 2013-06-30 LAB — RENAL FUNCTION PANEL
Albumin: 3.8 g/dL (ref 3.5–5.2)
CO2: 30 mEq/L (ref 19–32)
Creat: 0.96 mg/dL (ref 0.50–1.10)
Glucose, Bld: 90 mg/dL (ref 70–99)
Phosphorus: 3.4 mg/dL (ref 2.3–4.6)
Potassium: 4.3 mEq/L (ref 3.5–5.3)
Sodium: 137 mEq/L (ref 135–145)

## 2013-06-30 LAB — HEPATIC FUNCTION PANEL
ALT: 15 U/L (ref 0–35)
AST: 20 U/L (ref 0–37)
Albumin: 3.8 g/dL (ref 3.5–5.2)
Alkaline Phosphatase: 53 U/L (ref 39–117)
Bilirubin, Direct: 0.1 mg/dL (ref 0.0–0.3)
Total Bilirubin: 0.7 mg/dL (ref 0.3–1.2)

## 2013-06-30 LAB — T4, FREE: Free T4: 1.64 ng/dL (ref 0.80–1.80)

## 2013-07-07 ENCOUNTER — Ambulatory Visit (INDEPENDENT_AMBULATORY_CARE_PROVIDER_SITE_OTHER): Payer: Medicare Other | Admitting: Family Medicine

## 2013-07-07 ENCOUNTER — Encounter: Payer: Self-pay | Admitting: Family Medicine

## 2013-07-07 VITALS — BP 130/70 | HR 98 | Temp 97.9°F | Ht 61.0 in | Wt 115.1 lb

## 2013-07-07 DIAGNOSIS — H409 Unspecified glaucoma: Secondary | ICD-10-CM

## 2013-07-07 DIAGNOSIS — I10 Essential (primary) hypertension: Secondary | ICD-10-CM

## 2013-07-07 DIAGNOSIS — E785 Hyperlipidemia, unspecified: Secondary | ICD-10-CM

## 2013-07-07 DIAGNOSIS — M810 Age-related osteoporosis without current pathological fracture: Secondary | ICD-10-CM

## 2013-07-07 DIAGNOSIS — E538 Deficiency of other specified B group vitamins: Secondary | ICD-10-CM

## 2013-07-07 DIAGNOSIS — H01009 Unspecified blepharitis unspecified eye, unspecified eyelid: Secondary | ICD-10-CM

## 2013-07-07 DIAGNOSIS — E039 Hypothyroidism, unspecified: Secondary | ICD-10-CM

## 2013-07-07 DIAGNOSIS — E876 Hypokalemia: Secondary | ICD-10-CM

## 2013-07-07 DIAGNOSIS — H01001 Unspecified blepharitis right upper eyelid: Secondary | ICD-10-CM

## 2013-07-07 DIAGNOSIS — Z0000 Encounter for general adult medical examination without abnormal findings: Secondary | ICD-10-CM

## 2013-07-07 HISTORY — DX: Unspecified glaucoma: H40.9

## 2013-07-07 NOTE — Patient Instructions (Addendum)
Do you have a [place] or Living Will that addresses your medical wishes? If you do and would like [place] to scan a copy in your chart for future reference if so bring [place] in a copy  Stop Alendronate, will consider Prolia injections once every 6 months in office after bone Dexa Scan is done Call in 3 weeks if you have not heard your results     Osteoporosis Throughout your life, your body breaks down old bone and replaces it with new bone. As you get older, your body does not replace bone as quickly as it breaks it down. By the age of 30 years, most people begin to gradually lose bone because of the imbalance between bone loss and replacement. Some people lose more bone than others. Bone loss beyond a specified normal degree is considered osteoporosis.  Osteoporosis affects the strength and durability of your bones. The inside of the ends of your bones and your flat bones, like the bones of your pelvis, look like honeycomb, filled with tiny open spaces. As bone loss occurs, your bones become less dense. This means that the open spaces inside your bones become bigger and the walls between these spaces become thinner. This makes your bones weaker. Bones of a person with osteoporosis can become so weak that they can break (fracture) during minor accidents, such as a simple fall. CAUSES  The following factors have been associated with the development of osteoporosis:  Smoking.  Drinking more than 2 alcoholic drinks several days per week.  Long-term use of certain medicines:  Corticosteroids.  Chemotherapy medicines.  Thyroid medicines.  Antiepileptic medicines.  Gonadal hormone suppression medicine.  Immunosuppression medicine.  Being underweight.  Lack of physical activity.  Lack of exposure to the sun. This can lead to vitamin D deficiency.  Certain medical conditions:  Certain inflammatory bowel diseases, such as Crohn disease and ulcerative  colitis.  Diabetes.  Hyperthyroidism.  Hyperparathyroidism. RISK FACTORS Anyone can develop osteoporosis. However, the following factors can increase your risk of developing osteoporosis:  Gender Women are at higher risk than men.  Age Being older than 82 years increases your risk.  Ethnicity White and Asian people have an increased risk.  Weight Being extremely underweight can increase your risk of osteoporosis.  Family history of osteoporosis Having a family member who has developed osteoporosis can increase your risk. SYMPTOMS  Usually, people with osteoporosis have no symptoms.  DIAGNOSIS  Signs during a physical exam that may prompt your caregiver to suspect osteoporosis include:  Decreased height. This is usually caused by the compression of the bones that form your spine (vertebrae) because they have weakened and become fractured.  A curving or rounding of the upper back (kyphosis). To confirm signs of osteoporosis, your caregiver may request a procedure that uses 2 low-dose X-ray beams with different levels of energy to measure your bone mineral density (dual-energy X-ray absorptiometry [DXA]). Also, your caregiver may check your level of vitamin D. TREATMENT  The goal of osteoporosis treatment is to strengthen bones in order to decrease the risk of bone fractures. There are different types of medicines available to help achieve this goal. Some of these medicines work by slowing the processes of bone loss. Some medicines work by increasing bone density. Treatment also involves making sure that your levels of calcium and vitamin D are adequate. PREVENTION  There are things you can do to help prevent osteoporosis. Adequate intake of calcium and vitamin D can help you  achieve optimal bone mineral density. Regular exercise can also help, especially resistance and weight-bearing activities. If you smoke, quitting smoking is an important part of osteoporosis prevention. MAKE SURE  YOU:  Understand these instructions.  Will watch your condition.  Will get help right away if you are not doing well or get worse. FOR MORE INFORMATION www.osteo.org and [employer].ee Document Released: 03/28/2005 Document Revised: 10/13/2012 Document Reviewed: 06/02/2011 [facility] Patient Information 2014 [place], [place].

## 2013-07-07 NOTE — Progress Notes (Signed)
Pre visit review using our clinic review tool, if applicable. No additional management support is needed unless otherwise documented below in the visit note. 

## 2013-07-08 ENCOUNTER — Encounter: Payer: Self-pay | Admitting: Family Medicine

## 2013-07-08 ENCOUNTER — Ambulatory Visit: Payer: Medicare Other | Admitting: Family Medicine

## 2013-07-08 DIAGNOSIS — Z0000 Encounter for general adult medical examination without abnormal findings: Secondary | ICD-10-CM | POA: Insufficient documentation

## 2013-07-08 HISTORY — DX: Encounter for general adult medical examination without abnormal findings: Z00.00

## 2013-07-08 NOTE — Assessment & Plan Note (Signed)
Adequately treated, given shot today

## 2013-07-08 NOTE — Assessment & Plan Note (Signed)
Doing well. No trouble with falls and depression. Performing ADLs well. No recent vision or hearing changes. Encouraged heart healthy diet and regular exercise.

## 2013-07-08 NOTE — Assessment & Plan Note (Signed)
Simvastatin is well tolerated, no changes, avoid trans fats

## 2013-07-08 NOTE — Assessment & Plan Note (Signed)
Patient has been on Fosamax for greater than 10 years. Will d/c repeat Dexa scan and then likely proceed with prolia injections for further treatment. Continue [place] and Vitamin D

## 2013-07-08 NOTE — Progress Notes (Signed)
Patient ID: Mychal Durio, female   DOB: 02-Sep-1931, 80 y.o.   MRN: [id number] Ximena Todaro [id number] 08-11-1931 07/08/2013      Progress Note-Follow Up  Subjective  Chief Complaint  Chief Complaint  Patient presents with  . Annual Exam    physical    HPI  Patient is a 80 year old female who is in today for routine medical care. She generally feels well but has been struggling with blepharitis in her right eyelid. Her ophthalmologist has been adjusting meds and has tried Medrol Dosepak in stopping medications without relief. She is in the process of being referred to a specialist. She reports otherwise doing well. No recent illness. No chest pain, palpitations, shortness of breath, fevers or headaches. No GI or GU concerns. Taking medications as prescribed without difficulty.  Past Medical History  Diagnosis Date  . Hypothyroidism   . Hypertension     benign  . Cancer     thyroid  . Adenocarcinoma of breast   . Diverticular disease   . Colon polyps   . Hyperlipidemia   . Glaucoma 07/07/2013  . Preventative health care 07/08/2013    Past Surgical History  Procedure Laterality Date  . Thyroidectomy    . Breast lumpectomy      Family History  Problem Relation Age of Onset  . Cancer Mother     uterine  . Heart disease Father   . Cancer Brother     ?    History   Social History  . Marital Status: Widowed    Spouse Name: N/A    Number of Children: N/A  . Years of Education: N/A   Occupational History  . housewife    Social History Main Topics  . Smoking status: Never Smoker   . Smokeless tobacco: Never Used  . Alcohol Use: No  . Drug Use: No  . Sexual Activity: No   Other Topics Concern  . Not on file   Social History Narrative  . No narrative on file    Current Outpatient Prescriptions on File Prior to Visit  Medication Sig Dispense Refill  . aspirin 81 MG EC tablet Take 81 mg by mouth daily.        . calcium carbonate (OS-CAL) 600 MG TABS Take  600 mg by mouth 2 (two) times daily.        . cholecalciferol (VITAMIN D) 1000 UNITS tablet Take 1,600 Units by mouth daily.        . enalapril (VASOTEC) 20 MG tablet TAKE 1 TABLET BY MOUTH DAILY  90 tablet  1  . levothyroxine (SYNTHROID, LEVOTHROID) 88 MCG tablet TAKE 1 TABLET BY MOUTH EVERY DAY  90 tablet  0  . simvastatin (ZOCOR) 40 MG tablet TAKE 1/2 TABLET BY MOUTH EVERY DAY  45 tablet  1  . triamterene-hydrochlorothiazide (MAXZIDE-25) 37.5-25 MG per tablet TAKE 1 TABLET BY MOUTH DAILY.  90 tablet  0   No current facility-administered medications on file prior to visit.    Allergies  Allergen Reactions  . Sulfonamide Derivatives     Review of Systems  Review of Systems  Constitutional: Negative for fever and malaise/fatigue.  HENT: Negative for congestion.   Eyes: Positive for pain and discharge. Negative for blurred vision, double vision, photophobia and redness.  Respiratory: Negative for shortness of breath.   Cardiovascular: Negative for chest pain, palpitations and leg swelling.  Gastrointestinal: Negative for nausea, abdominal pain and diarrhea.  Genitourinary: Negative for dysuria.  Musculoskeletal: Negative for falls.  Skin:  Negative for rash.  Neurological: Negative for loss of consciousness and headaches.  Endo/Heme/Allergies: Negative for polydipsia.  Psychiatric/Behavioral: Negative for depression and suicidal ideas. The patient is not nervous/anxious and does not have insomnia.     Objective  BP 130/70  Pulse 98  Temp(Src) 97.9 F (36.6 C) (Oral)  Ht 5\' 1"  (1.549 m)  Wt 115 lb 1.9 oz (52.218 kg)  BMI 21.76 kg/m2  SpO2 97%  Physical Exam  Physical Exam  Constitutional: She is oriented to person, place, and time and well-developed, well-nourished, and in no distress. No distress.  HENT:  Head: Normocephalic and atraumatic.  Eyes: Conjunctivae are normal.  Neck: Neck supple. No thyromegaly present.  Cardiovascular: Normal rate, regular rhythm and  normal heart sounds.   No murmur heard. Pulmonary/Chest: Effort normal and breath sounds normal. She has no wheezes.  Abdominal: She exhibits no distension and no mass.  Musculoskeletal: She exhibits no edema.  Lymphadenopathy:    She has no cervical adenopathy.  Neurological: She is alert and oriented to person, place, and time.  Skin: Skin is warm and dry. No rash noted. She is not diaphoretic.  Psychiatric: Memory, affect and judgment normal.    Lab Results  Component Value Date   TSH 1.076 06/29/2013   Lab Results  Component Value Date   WBC 5.6 06/29/2013   HGB 14.6 06/29/2013   HCT 42.4 06/29/2013   MCV 92.8 06/29/2013   PLT 290 06/29/2013   Lab Results  Component Value Date   CREATININE 0.96 06/29/2013   BUN 19 06/29/2013   NA 137 06/29/2013   K 4.3 06/29/2013   CL 98 06/29/2013   CO2 30 06/29/2013   Lab Results  Component Value Date   ALT 15 06/29/2013   AST 20 06/29/2013   ALKPHOS 53 06/29/2013   BILITOT 0.7 06/29/2013   Lab Results  Component Value Date   CHOL 159 06/29/2013   Lab Results  Component Value Date   HDL 58 06/29/2013   Lab Results  Component Value Date   LDLCALC 88 06/29/2013   Lab Results  Component Value Date   TRIG 66 06/29/2013   Lab Results  Component Value Date   CHOLHDL 2.7 06/29/2013     Assessment & Plan  HYPERTENSION, BENIGN Well controlled, EKG WNL today. No changes  B12 deficiency Adequately treated, given shot today  HYPOTHYROIDISM Well controlled on current dose of Levothyroxine  HYPERLIPIDEMIA Simvastatin is well tolerated, no changes, avoid trans fats  Blepharitis of right upper eyelid Recurrent is following with Dr Tommy Rainwater and Dr Carson Myrtle and has had only a partial response to changes in meds, is being referred to a subspecialist.   OSTEOPOROSIS Patient has been on Fosamax for greater than 10 years. Will d/c repeat Dexa scan and then likely proceed with prolia injections for further treatment. Continue  Oscal and Vitamin D   HYPOKALEMIA resolved  Preventative health care Doing well. No trouble with falls and depression. Performing ADLs well. No recent vision or hearing changes. Encouraged heart healthy diet and regular exercise.

## 2013-07-08 NOTE — Assessment & Plan Note (Signed)
Well controlled on current dose of Levothyroxine. 

## 2013-07-08 NOTE — Assessment & Plan Note (Signed)
Recurrent is following with Dr Tommy Rainwater and Dr Carson Myrtle and has had only a partial response to changes in meds, is being referred to a subspecialist.

## 2013-07-08 NOTE — Assessment & Plan Note (Signed)
Well controlled, EKG WNL today. No changes

## 2013-07-08 NOTE — Assessment & Plan Note (Signed)
resolved 

## 2013-07-09 ENCOUNTER — Other Ambulatory Visit: Payer: Medicare Other

## 2013-07-24 ENCOUNTER — Telehealth: Payer: Self-pay

## 2013-07-24 NOTE — Telephone Encounter (Signed)
Pt informed per Dr Charlett Blake that Dexa scan was normal.  Pt would like to know if she is still to continue on the shots twice a year that MD recommended?  Please advise?

## 2013-07-27 ENCOUNTER — Other Ambulatory Visit: Payer: Self-pay | Admitting: Family Medicine

## 2013-07-27 NOTE — Telephone Encounter (Signed)
Patient called back about this stating that she has additional questions that she wants to talk to the nurse about.

## 2013-07-27 NOTE — Telephone Encounter (Signed)
So I see where we ordered her Dexa scan on 07/07/13 but cannot find the new result. Did it come from [place] and is not scanned yet? I need to review it again before I can be sure what patient should do

## 2013-07-28 NOTE — Telephone Encounter (Signed)
This was sent to be scanned. Its not showing up under the media tab yet

## 2013-07-28 NOTE — Telephone Encounter (Signed)
So please call them and have them fax [place] a copy so I can answer question Thanks or request a new copy if we can find out where it was done

## 2013-07-30 ENCOUNTER — Encounter: Payer: Self-pay | Admitting: Family Medicine

## 2013-07-30 NOTE — Telephone Encounter (Signed)
Dexa Scan now in system.

## 2013-07-30 NOTE — Telephone Encounter (Signed)
Can you please call medical records and get a copy of the Dexa scan report that's not scanned in.

## 2013-07-30 NOTE — Telephone Encounter (Signed)
Please print me both scans and I will review

## 2013-07-30 NOTE — Telephone Encounter (Signed)
Spoke to the scanning department and they will try to find document and scan into chart. Will call when this has been done.

## 2013-07-31 ENCOUNTER — Telehealth: Payer: Self-pay | Admitting: Family Medicine

## 2013-07-31 NOTE — Telephone Encounter (Signed)
Both printouts put on md's desk

## 2013-08-02 NOTE — Telephone Encounter (Signed)
I have reviewed her 2012 and now 2015 Dexa scans to confirm. She was diagnosed as osteopenia in 2012 now her scan is normal in spine, is mildly thin in hip but no need for further treatments at this time. Needs to maintain activity and calcium and vitamin d intake.

## 2013-08-03 NOTE — Telephone Encounter (Signed)
Left a detailed message on patients vm 

## 2013-08-04 ENCOUNTER — Telehealth: Payer: Self-pay

## 2013-08-04 NOTE — Telephone Encounter (Signed)
Patient would like a call back. She has a question about her labs

## 2013-08-05 NOTE — Telephone Encounter (Signed)
Left pt a message for pt to return our call

## 2013-08-05 NOTE — Telephone Encounter (Signed)
She should remain off fosamax.  Dr. Charlett Blake may want to start her on prolia injections.  I will let her decide about this when she returns from vacation.

## 2013-08-05 NOTE — Telephone Encounter (Signed)
So I would like to start her on Prolia, please proceed with prior auth. She can take Fosamax til then

## 2013-08-05 NOTE — Telephone Encounter (Signed)
Patient returned phone call. Best # [phone]. She states that Dr. Charlett Blake took her off alendronate on 07/07/13. She wants to know if she should continue taking this medication or should she not take this medication.

## 2013-08-05 NOTE — Telephone Encounter (Signed)
Please advise 

## 2013-08-06 NOTE — Telephone Encounter (Signed)
Sent the PA request to [facility]

## 2013-08-06 NOTE — Telephone Encounter (Signed)
Patient called back regarding this. I informed her of what Dr. Charlett Blake instructed and that we would call her when we hear from the insurance company regarding prolia

## 2013-08-19 ENCOUNTER — Other Ambulatory Visit: Payer: Self-pay | Admitting: Family Medicine

## 2013-08-24 NOTE — Telephone Encounter (Signed)
Please advise 

## 2013-08-24 NOTE — Telephone Encounter (Signed)
Sure she can continue the Alendronate for now as long as she is not having significant abdominal pain when she takes it

## 2013-08-24 NOTE — Telephone Encounter (Signed)
Patient called in stating that she does not want the prolia injection. She says that she has appointment in July with Dr. Charlett Blake and will talk to her than about prolia. She does want to know if she should still be taking alendronate once weekly until her July appointment.

## 2013-08-25 NOTE — Telephone Encounter (Signed)
In another phone note

## 2013-08-25 NOTE — Telephone Encounter (Signed)
Message copied by Varney Daily on Tue Aug 25, 2013  9:15 AM ------      Message from: Irven Baltimore      Created: Thu Aug 20, 2013  3:32 PM       Called patient to scheduled this appointment and she had no idea what I as talking about. She states that no one at our office has talked to her about prolia injections. Patient would like a callback from a [place].            ----- Message -----         From: Varney Daily, RMA         Sent: 08/19/2013   4:58 PM           To: Irven Baltimore            Please call and inform pt : Judy Norman ins has approved her Prolia inj.  Her 2ndary plan follows Medicare guidelines, so both will pay according to the information I've rec'd from [place].             Schedule her Prolia injection also      ----- Message -----         From: Carollee Sires McClinton         Sent: 08/13/2013   8:51 AM           To: Varney Daily, RMA            Judy Norman,            Judy Norman's ins has approved her Prolia inj.  Her 2ndary plan follows Medicare guidelines, so both will pay according to the information I've rec'd from [place].  Will send you copies in inter-office mail.  Please let me know the date she schedules the injection.            Thanks,      Bertram Millard      ----- Message -----         From: Varney Daily, RMA         Sent: 08/06/2013   7:19 AM           To: Carollee Sires McClinton            Please do a PA for Prolia.            Thank you                   ------

## 2013-08-25 NOTE — Telephone Encounter (Signed)
Pt states she will continue this until she comes to her appt

## 2013-09-07 ENCOUNTER — Other Ambulatory Visit: Payer: Self-pay | Admitting: Family Medicine

## 2013-10-05 ENCOUNTER — Other Ambulatory Visit: Payer: Self-pay | Admitting: Family Medicine

## 2013-10-26 ENCOUNTER — Other Ambulatory Visit: Payer: Self-pay | Admitting: Family Medicine

## 2013-11-18 ENCOUNTER — Other Ambulatory Visit: Payer: Self-pay | Admitting: Family Medicine

## 2013-12-16 ENCOUNTER — Other Ambulatory Visit: Payer: Self-pay | Admitting: Family Medicine

## 2013-12-23 ENCOUNTER — Other Ambulatory Visit: Payer: Self-pay | Admitting: Family Medicine

## 2013-12-28 ENCOUNTER — Other Ambulatory Visit: Payer: Self-pay | Admitting: Family Medicine

## 2013-12-28 LAB — LIPID PANEL
CHOLESTEROL: 165 mg/dL (ref 0–200)
HDL: 57 mg/dL (ref >39)
LDL Cholesterol: 91 mg/dL (ref 0–99)
TRIGLYCERIDES: 86 mg/dL (ref <150)
Total CHOL/HDL Ratio: 2.9 Ratio
VLDL: 17 mg/dL (ref 0–40)

## 2013-12-28 LAB — T4, FREE: FREE T4: 1.33 ng/dL (ref 0.80–1.80)

## 2013-12-28 LAB — HEPATIC FUNCTION PANEL
ALK PHOS: 44 U/L (ref 39–117)
ALT: 13 U/L (ref 0–35)
AST: 17 U/L (ref 0–37)
Albumin: 4 g/dL (ref 3.5–5.2)
BILIRUBIN INDIRECT: 0.5 mg/dL (ref 0.2–1.2)
Bilirubin, Direct: 0.1 mg/dL (ref 0.0–0.3)
TOTAL PROTEIN: 6.7 g/dL (ref 6.0–8.3)
Total Bilirubin: 0.6 mg/dL (ref 0.2–1.2)

## 2013-12-28 LAB — BASIC METABOLIC PANEL
BUN: 21 mg/dL (ref 6–23)
CO2: 30 mEq/L (ref 19–32)
Calcium: 9.7 mg/dL (ref 8.4–10.5)
Chloride: 98 mEq/L (ref 96–112)
Creat: 0.95 mg/dL (ref 0.50–1.10)
Glucose, Bld: 78 mg/dL (ref 70–99)
Potassium: 3.9 mEq/L (ref 3.5–5.3)
Sodium: 136 mEq/L (ref 135–145)

## 2013-12-28 LAB — TSH: TSH: 0.238 u[IU]/mL — ABNORMAL LOW (ref 0.350–4.500)

## 2013-12-28 LAB — VITAMIN B12: Vitamin B-12: 375 pg/mL (ref 211–911)

## 2014-01-07 ENCOUNTER — Ambulatory Visit (INDEPENDENT_AMBULATORY_CARE_PROVIDER_SITE_OTHER): Payer: Medicare Other | Admitting: Family Medicine

## 2014-01-07 ENCOUNTER — Encounter: Payer: Self-pay | Admitting: Family Medicine

## 2014-01-07 VITALS — BP 122/60 | HR 65 | Temp 98.5°F | Ht 61.0 in | Wt 114.1 lb

## 2014-01-07 DIAGNOSIS — M810 Age-related osteoporosis without current pathological fracture: Secondary | ICD-10-CM

## 2014-01-07 DIAGNOSIS — E039 Hypothyroidism, unspecified: Secondary | ICD-10-CM

## 2014-01-07 DIAGNOSIS — E785 Hyperlipidemia, unspecified: Secondary | ICD-10-CM

## 2014-01-07 DIAGNOSIS — I10 Essential (primary) hypertension: Secondary | ICD-10-CM

## 2014-01-07 NOTE — Assessment & Plan Note (Signed)
Most recent bone scan normal encouraged ongoing exercise and add viitamin d to ca/vit  D supplements. Stop Alendronate

## 2014-01-07 NOTE — Assessment & Plan Note (Signed)
Well controlled, no changes to meds. Encouraged heart healthy diet such as the DASH diet and exercise as tolerated.  °

## 2014-01-07 NOTE — Progress Notes (Signed)
Pre visit review using our clinic review tool, if applicable. No additional management support is needed unless otherwise documented below in the visit note. 

## 2014-01-07 NOTE — Assessment & Plan Note (Signed)
Tolerating statin, encouraged heart healthy diet, avoid trans fats, minimize simple carbs and saturated fats. Increase exercise as tolerated 

## 2014-01-07 NOTE — Progress Notes (Signed)
Patient ID: Judy Norman, female   DOB: [birth year], 80 y.o.   MRN: [id number] Judy Norman [id number] 11-04-1931 01/07/2014      Progress Note-Follow Up  Subjective  Chief Complaint  Chief Complaint  Patient presents with  . Follow-up    6 month    HPI  Patient is a 80 year old female in today for routine medical care. Is doing well. He has recently been seen by ophthalmology plastic surgeon secondary to redundant skin around her eyes but no intervention was undertaken. She has been taking alendronate for greater than 10 years. No difficulty with the medication. She is noted to have had benign colon polyps in the past and saw equal 3 years ago and had a colonoscopy. No recent illness. Denies CP/palp/SOB/HA/congestion/fevers/GI or GU c/o. Taking meds as prescribed   Past Medical History  Diagnosis Date  . Hypothyroidism   . Hypertension     benign  . Cancer     thyroid  . Adenocarcinoma of breast   . Diverticular disease   . Colon polyps   . Hyperlipidemia   . Glaucoma 07/07/2013  . Preventative health care 07/08/2013    Past Surgical History  Procedure Laterality Date  . Thyroidectomy    . Breast lumpectomy      Family History  Problem Relation Age of Onset  . Cancer Mother     uterine  . Heart disease Father   . Cancer Brother     ?    History   Social History  . Marital Status: Widowed    Spouse Name: N/A    Number of Children: N/A  . Years of Education: N/A   Occupational History  . housewife    Social History Main Topics  . Smoking status: Never Smoker   . Smokeless tobacco: Never Used  . Alcohol Use: No  . Drug Use: No  . Sexual Activity: No   Other Topics Concern  . Not on file   Social History Narrative  . No narrative on file    Current Outpatient Prescriptions on File Prior to Visit  Medication Sig Dispense Refill  . aspirin 81 MG EC tablet Take 81 mg by mouth daily.        . calcium carbonate (OS-CAL) 600 MG TABS Take 600 mg by  mouth 2 (two) times daily.        . cholecalciferol (VITAMIN D) 1000 UNITS tablet Take 1,600 Units by mouth daily.        . enalapril (VASOTEC) 20 MG tablet TAKE 1 TABLET BY MOUTH DAILY  90 tablet  0  . levothyroxine (SYNTHROID, LEVOTHROID) 88 MCG tablet TAKE 1 TABLET BY MOUTH EVERY DAY  90 tablet  0  . simvastatin (ZOCOR) 40 MG tablet TAKE 1/2 TABLET BY MOUTH EVERY DAY  45 tablet  1  . triamterene-hydrochlorothiazide (MAXZIDE-25) 37.5-25 MG per tablet TAKE 1 TABLET BY MOUTH EVERY DAY  90 tablet  1   No current facility-administered medications on file prior to visit.    Allergies  Allergen Reactions  . Sulfonamide Derivatives     Review of Systems  Review of Systems  Constitutional: Negative for fever and malaise/fatigue.  HENT: Negative for congestion.   Eyes: Negative for discharge.  Respiratory: Negative for shortness of breath.   Cardiovascular: Negative for chest pain, palpitations and leg swelling.  Gastrointestinal: Negative for nausea, abdominal pain and diarrhea.  Genitourinary: Negative for dysuria.  Musculoskeletal: Negative for falls.  Skin: Negative for rash.  Neurological: Negative  for loss of consciousness and headaches.  Endo/Heme/Allergies: Negative for polydipsia.  Psychiatric/Behavioral: Negative for depression and suicidal ideas. The patient is not nervous/anxious and does not have insomnia.     Objective  BP 122/60  Pulse 65  Temp(Src) 98.5 F (36.9 C) (Oral)  Ht 5\' 1"  (1.549 m)  Wt 114 lb 1.3 oz (51.746 kg)  BMI 21.57 kg/m2  SpO2 97%  Physical Exam  Physical Exam  Constitutional: She is oriented to person, place, and time and well-developed, well-nourished, and in no distress. No distress.  HENT:  Head: Normocephalic and atraumatic.  Eyes: Conjunctivae are normal.  Neck: Neck supple. No thyromegaly present.  Cardiovascular: Normal rate, regular rhythm and normal heart sounds.   No murmur heard. Pulmonary/Chest: Effort normal and breath  sounds normal. She has no wheezes.  Abdominal: She exhibits no distension and no mass.  Musculoskeletal: She exhibits no edema.  Lymphadenopathy:    She has no cervical adenopathy.  Neurological: She is alert and oriented to person, place, and time.  Skin: Skin is warm and dry. No rash noted. She is not diaphoretic.  Psychiatric: Memory, affect and judgment normal.    Lab Results  Component Value Date   TSH 0.238* 12/28/2013   Lab Results  Component Value Date   WBC 5.6 06/29/2013   HGB 14.6 06/29/2013   HCT 42.4 06/29/2013   MCV 92.8 06/29/2013   PLT 290 06/29/2013   Lab Results  Component Value Date   CREATININE 0.95 12/28/2013   BUN 21 12/28/2013   NA 136 12/28/2013   K 3.9 12/28/2013   CL 98 12/28/2013   CO2 30 12/28/2013   Lab Results  Component Value Date   ALT 13 12/28/2013   AST 17 12/28/2013   ALKPHOS 44 12/28/2013   BILITOT 0.6 12/28/2013   Lab Results  Component Value Date   CHOL 165 12/28/2013   Lab Results  Component Value Date   HDL 57 12/28/2013   Lab Results  Component Value Date   LDLCALC 91 12/28/2013   Lab Results  Component Value Date   TRIG 86 12/28/2013   Lab Results  Component Value Date   CHOLHDL 2.9 12/28/2013     Assessment & Plan  HYPERTENSION, BENIGN Well controlled, no changes to meds. Encouraged heart healthy diet such as the DASH diet and exercise as tolerated.   HYPOTHYROIDISM On Levothyroxine, continue to monitor  HYPERLIPIDEMIA Tolerating statin, encouraged heart healthy diet, avoid trans fats, minimize simple carbs and saturated fats. Increase exercise as tolerated  OSTEOPOROSIS Most recent bone scan normal encouraged ongoing exercise and add viitamin d to ca/vit  D supplements. Stop Alendronate

## 2014-01-07 NOTE — Patient Instructions (Addendum)
Needs labs only in 3 months to check TSH and free T4  Call in roughly 3 weeks to see if polyp results came in  Take 1 Oscal calcium/vitamin D tabs twice daily Add Vitamin D 1000 to 2000 IU daily Exercise upper body daily  Hypothyroidism The thyroid is a large gland located in the lower front of your neck. The thyroid gland helps control metabolism. Metabolism is how your body handles food. It controls metabolism with the hormone thyroxine. When this gland is underactive (hypothyroid), it produces too little hormone.  CAUSES These include:   Absence or destruction of thyroid tissue.  Goiter due to iodine deficiency.  Goiter due to medications.  Congenital defects (since birth).  Problems with the pituitary. This causes a lack of TSH (thyroid stimulating hormone). This hormone tells the thyroid to turn out more hormone. SYMPTOMS  Lethargy (feeling as though you have no energy)  Cold intolerance  Weight gain (in spite of normal food intake)  Dry skin  Coarse hair  Menstrual irregularity (if severe, may lead to infertility)  Slowing of thought processes Cardiac problems are also caused by insufficient amounts of thyroid hormone. Hypothyroidism in the newborn is cretinism, and is an extreme form. It is important that this form be treated adequately and immediately or it will lead rapidly to retarded physical and mental development. DIAGNOSIS  To prove hypothyroidism, your caregiver may do blood tests and ultrasound tests. Sometimes the signs are hidden. It may be necessary for your caregiver to watch this illness with blood tests either before or after diagnosis and treatment. TREATMENT  Low levels of thyroid hormone are increased by using synthetic thyroid hormone. This is a safe, effective treatment. It usually takes about four weeks to gain the full effects of the medication. After you have the full effect of the medication, it will generally take another four weeks for  problems to leave. Your caregiver may start you on low doses. If you have had heart problems the dose may be gradually increased. It is generally not an emergency to get rapidly to normal. HOME CARE INSTRUCTIONS   Take your medications as your caregiver suggests. Let your caregiver know of any medications you are taking or start taking. Your caregiver will help you with dosage schedules.  As your condition improves, your dosage needs may increase. It will be necessary to have continuing blood tests as suggested by your caregiver.  Report all suspected medication side effects to your caregiver. SEEK MEDICAL CARE IF: Seek medical care if you develop:  Sweating.  Tremulousness (tremors).  Anxiety.  Rapid weight loss.  Heat intolerance.  Emotional swings.  Diarrhea.  Weakness. SEEK IMMEDIATE MEDICAL CARE IF:  You develop chest pain, an irregular heart beat (palpitations), or a rapid heart beat. MAKE SURE YOU:   Understand these instructions.  Will watch your condition.  Will get help right away if you are not doing well or get worse. Document Released: 06/18/2005 Document Revised: 09/10/2011 Document Reviewed: 02/06/2008 [facility] Patient Information 2015 [place], [place]. This information is not intended to replace advice given to you by your health care provider. Make sure you discuss any questions you have with your health care provider.

## 2014-01-07 NOTE — Assessment & Plan Note (Signed)
On Levothyroxine, continue to monitor 

## 2014-01-21 ENCOUNTER — Other Ambulatory Visit: Payer: Self-pay | Admitting: Family Medicine

## 2014-02-04 ENCOUNTER — Telehealth: Payer: Self-pay

## 2014-02-04 NOTE — Telephone Encounter (Signed)
Patient called stating that her last Dr stated she didn't need a colonoscopy after age 80? Pt states that at her appt in July MD stated she would call where she got her last colonoscopy (pt couldn't remember where she went) and see what they said. Pt states that MD told her to call if she didn't hear back from MD in a couple of weeks?  Pt would like to know if MD spoke to her last GI doctor?  Please advise?

## 2014-02-04 NOTE — Telephone Encounter (Signed)
She had a colonoscopy in 2012, no concerning polyps she does not need a colonoscopy unless she develops symptoms

## 2014-02-05 NOTE — Telephone Encounter (Signed)
Patient informed and voiced understanding

## 2014-03-16 ENCOUNTER — Other Ambulatory Visit: Payer: Self-pay | Admitting: Family Medicine

## 2014-04-01 ENCOUNTER — Telehealth: Payer: Self-pay | Admitting: Family

## 2014-04-01 ENCOUNTER — Ambulatory Visit (HOSPITAL_BASED_OUTPATIENT_CLINIC_OR_DEPARTMENT_OTHER): Payer: Medicare Other | Admitting: Family

## 2014-04-01 ENCOUNTER — Other Ambulatory Visit (HOSPITAL_BASED_OUTPATIENT_CLINIC_OR_DEPARTMENT_OTHER): Payer: Medicare Other | Admitting: Lab

## 2014-04-01 ENCOUNTER — Encounter: Payer: Self-pay | Admitting: Family

## 2014-04-01 VITALS — BP 128/61 | HR 62 | Temp 97.7°F | Resp 14 | Ht 61.0 in | Wt 114.0 lb

## 2014-04-01 DIAGNOSIS — T386X5A Adverse effect of antigonadotrophins, antiestrogens, antiandrogens, not elsewhere classified, initial encounter: Secondary | ICD-10-CM

## 2014-04-01 DIAGNOSIS — Z853 Personal history of malignant neoplasm of breast: Secondary | ICD-10-CM

## 2014-04-01 DIAGNOSIS — M818 Other osteoporosis without current pathological fracture: Secondary | ICD-10-CM

## 2014-04-01 DIAGNOSIS — C73 Malignant neoplasm of thyroid gland: Secondary | ICD-10-CM

## 2014-04-01 DIAGNOSIS — C50919 Malignant neoplasm of unspecified site of unspecified female breast: Secondary | ICD-10-CM

## 2014-04-01 DIAGNOSIS — L409 Psoriasis, unspecified: Secondary | ICD-10-CM | POA: Diagnosis not present

## 2014-04-01 LAB — CBC WITH DIFFERENTIAL (CANCER CENTER ONLY)
BASO#: 0 10*3/uL (ref 0.0–0.2)
BASO%: 0.3 % (ref 0.0–2.0)
EOS%: 0.6 % (ref 0.0–7.0)
Eosinophils Absolute: 0 10*3/uL (ref 0.0–0.5)
HCT: 41.1 % (ref 34.8–46.6)
HEMOGLOBIN: 14.2 g/dL (ref 11.6–15.9)
LYMPH#: 1.2 10*3/uL (ref 0.9–3.3)
LYMPH%: 18.4 % (ref 14.0–48.0)
MCH: 32.3 pg (ref 26.0–34.0)
MCHC: 34.5 g/dL (ref 32.0–36.0)
MCV: 94 fL (ref 81–101)
MONO#: 0.6 10*3/uL (ref 0.1–0.9)
MONO%: 9.1 % (ref 0.0–13.0)
NEUT#: 4.7 10*3/uL (ref 1.5–6.5)
NEUT%: 71.6 % (ref 39.6–80.0)
Platelets: 262 10*3/uL (ref 145–400)
RBC: 4.39 10*6/uL (ref 3.70–5.32)
RDW: 13.9 % (ref 11.1–15.7)
WBC: 6.6 10*3/uL (ref 3.9–10.0)

## 2014-04-01 LAB — COMPREHENSIVE METABOLIC PANEL
ALBUMIN: 4 g/dL (ref 3.5–5.2)
ALT: 16 U/L (ref 0–35)
AST: 18 U/L (ref 0–37)
Alkaline Phosphatase: 48 U/L (ref 39–117)
BUN: 18 mg/dL (ref 6–23)
CALCIUM: 9.8 mg/dL (ref 8.4–10.5)
CHLORIDE: 97 meq/L (ref 96–112)
CO2: 27 mEq/L (ref 19–32)
Creatinine, Ser: 0.97 mg/dL (ref 0.50–1.10)
Glucose, Bld: 91 mg/dL (ref 70–99)
POTASSIUM: 3.9 meq/L (ref 3.5–5.3)
SODIUM: 133 meq/L — AB (ref 135–145)
TOTAL PROTEIN: 6.5 g/dL (ref 6.0–8.3)
Total Bilirubin: 0.6 mg/dL (ref 0.2–1.2)

## 2014-04-01 LAB — TSH CHCC: TSH: 0.205 m(IU)/L — ABNORMAL LOW (ref 0.308–3.960)

## 2014-04-01 NOTE — Telephone Encounter (Signed)
NP aware pt would not let me schedule mammogram for this month. Pt said she would schedule it herself

## 2014-04-01 NOTE — Progress Notes (Signed)
[place]  [phone] [phone]  ID: Judy Norman OB: 05-21-1932 MR#: [id number] JIR#:[id number] Patient Care Team: Mosie Lukes, MD as PCP - General (Family Medicine)  DIAGNOSIS: Stage I (T1 N0 M0) ductal carcinoma of the left breast  INTERVAL HISTORY: Judy Norman is here today for a follow-up. She is doing well. She has had no issues since we saw her last year. She denies fever, chills, n/v, cough, headache, dizziness, SOB, chest pain, palpitations, abdominal pain, constipation, diarrhea, blood in urine or stool. No swelling, tenderness, numbness or tingling in her extremities. She has had no bleeding or pain. She does have small patch of what appears to be psoriases at the 10 o'clock position on her left breast. Her breast is not red or edematous. We will try cortisone cream and see if that clears it up. She is keeping an eye on this and her PCP is also aware. She is still walking a lot. Her last mammogram was in October 2014 and was negative for any abdormalities or malignancy. We will order her mammogram to be done this month. Her appetite is good and she is drinking plenty of fluids. Overall, she appears to be doing quite well.   CURRENT TREATMENT: Observation  REVIEW OF SYSTEMS: All other 10 point review of systems is negative except those issues mentioned above.   PAST MEDICAL HISTORY: Past Medical History  Diagnosis Date  . Hypothyroidism   . Hypertension     benign  . Cancer     thyroid  . Adenocarcinoma of breast   . Diverticular disease   . Colon polyps   . Hyperlipidemia   . Glaucoma 07/07/2013  . Preventative health care 07/08/2013   PAST SURGICAL HISTORY: Past Surgical History  Procedure Laterality Date  . Thyroidectomy    . Breast lumpectomy     FAMILY HISTORY Family History  Problem Relation Age of Onset  . Cancer Mother     uterine  . Heart disease Father   . Cancer Brother     ?   GYNECOLOGIC HISTORY:  No LMP  recorded. Patient is postmenopausal.   SOCIAL HISTORY:  History   Social History  . Marital Status: Widowed    Spouse Name: N/A    Number of Children: N/A  . Years of Education: N/A   Occupational History  . housewife    Social History Main Topics  . Smoking status: Never Smoker   . Smokeless tobacco: Never Used     Comment: never used tobacco  . Alcohol Use: No  . Drug Use: No  . Sexual Activity: No   Other Topics Concern  . Not on file   Social History Narrative  . No narrative on file   ADVANCED DIRECTIVES: <no information>  HEALTH MAINTENANCE: History  Substance Use Topics  . Smoking status: Never Smoker   . Smokeless tobacco: Never Used     Comment: never used tobacco  . Alcohol Use: No   Colonoscopy: PAP: Bone density: Lipid panel:  Allergies  Allergen Reactions  . Sulfonamide Derivatives    Current Outpatient Prescriptions  Medication Sig Dispense Refill  . aspirin 81 MG EC tablet Take 81 mg by mouth daily.        . calcium carbonate (OS-CAL) 600 MG TABS Take 600 mg by mouth 2 (two) times daily.        . Cholecalciferol (VITAMIN D-3 PO) Take 2,500 Units by mouth every morning.      . enalapril (VASOTEC)  20 MG tablet TAKE 1 TABLET BY MOUTH DAILY  90 tablet  0  . levothyroxine (SYNTHROID, LEVOTHROID) 88 MCG tablet TAKE 1 TABLET BY MOUTH EVERY DAY  90 tablet  0  . simvastatin (ZOCOR) 40 MG tablet TAKE 1/2 TABLET BY MOUTH EVERY DAY  45 tablet  1  . triamterene-hydrochlorothiazide (MAXZIDE-25) 37.5-25 MG per tablet TAKE 1 TABLET BY MOUTH EVERY DAY  90 tablet  1   No current facility-administered medications for this visit.   OBJECTIVE: Filed Vitals:   04/01/14 1026  BP: 128/61  Pulse: 62  Temp: 97.7 F (36.5 C)  Resp: 14   Body mass index is 21.55 kg/(m^2). ECOG FS:0 - Asymptomatic Ocular: Sclerae unicteric, pupils equal, round and reactive to light Ear-nose-throat: Oropharynx clear, dentition fair Lymphatic: No cervical or supraclavicular  adenopathy Lungs no rales or rhonchi, good excursion bilaterally Heart regular rate and rhythm, no murmur appreciated Abd soft, nontender, positive bowel sounds MSK no focal spinal tenderness, no joint edema Neuro: non-focal, well-oriented, appropriate affect Breasts: No lumps, bumps or lymphadenopathy. Has small patch of what appears to be psoriasis on her left breast at the 10 o'clock position.   LAB RESULTS: CMP     Component Value Date/Time   NA 136 12/28/2013 0935   K 3.9 12/28/2013 0935   CL 98 12/28/2013 0935   CO2 30 12/28/2013 0935   GLUCOSE 78 12/28/2013 0935   BUN 21 12/28/2013 0935   CREATININE 0.95 12/28/2013 0935   CREATININE 0.98 04/01/2013 0853   CALCIUM 9.7 12/28/2013 0935   PROT 6.7 12/28/2013 0935   ALBUMIN 4.0 12/28/2013 0935   AST 17 12/28/2013 0935   ALT 13 12/28/2013 0935   ALKPHOS 44 12/28/2013 0935   BILITOT 0.6 12/28/2013 0935   GFRNONAA >60 02/06/2009 1510   GFRAA  Value: >60        The eGFR has been calculated using the MDRD equation. This calculation has not been validated in all clinical situations. eGFR's persistently <60 mL/min signify possible Chronic Kidney Disease. 02/06/2009 1510   No results found for this basename: SPEP, UPEP,  kappa and lambda light chains   Lab Results  Component Value Date   WBC 6.6 04/01/2014   NEUTROABS 4.7 04/01/2014   HGB 14.2 04/01/2014   HCT 41.1 04/01/2014   MCV 94 04/01/2014   PLT 262 04/01/2014    No results found for this basename: LABCA2   No components found with this basename: [id number]   No results found for this basename: INR,  in the last 168 hours  STUDIES: No results found.  ASSESSMENT/PLAN: Judy Norman is a very charming 80 year old white female with stage I infiltrating ductal carcinoma of the left breast. She underwent lumpectomy and radiation. She completed Femara back in November of 2013. She is completely asymptomatic at this time.  Her CBC today was good.  She will continue to monitor. The psoriasis on her  left breast and put cortisone cream on it. She will notify us if the rash persists or gets worse.  We will see her back in 1 year for labs and follow-up.  I put in the order for her mammogram but she would not schedule it today with Liliane Channel. She states that she will take care of scheduling it herself. She knows to call here with any questions or concerns and to go to the ED in the event of an emergency.   Eliezer Bottom, NP 04/01/2014 11:01 AM

## 2014-04-07 ENCOUNTER — Telehealth: Payer: Self-pay | Admitting: Family Medicine

## 2014-04-07 NOTE — Telephone Encounter (Signed)
Caller name: Francesca Jewett  Call back [phone]   Reason for call:  Wants results from results from TSH.  Can leave message.

## 2014-04-07 NOTE — Telephone Encounter (Signed)
Her TSH is still mildly suppressed, similar to last check if she is having any symptoms IE palpitations then we should lower her dose of levothyroxine since this means she is mildly over treated. If no symptoms can continue to monitor

## 2014-04-08 NOTE — Telephone Encounter (Signed)
Left a lengthy detailed message on vm

## 2014-04-21 ENCOUNTER — Other Ambulatory Visit: Payer: Self-pay | Admitting: Family Medicine

## 2014-05-03 ENCOUNTER — Encounter (INDEPENDENT_AMBULATORY_CARE_PROVIDER_SITE_OTHER): Payer: Self-pay

## 2014-05-03 ENCOUNTER — Ambulatory Visit
Admission: RE | Admit: 2014-05-03 | Discharge: 2014-05-03 | Disposition: A | Discharge status: Home / Self Care | Payer: Medicare Other | Source: Ambulatory Visit | Attending: Family | Admitting: Family

## 2014-05-03 DIAGNOSIS — C50919 Malignant neoplasm of unspecified site of unspecified female breast: Secondary | ICD-10-CM

## 2014-05-17 ENCOUNTER — Other Ambulatory Visit: Payer: Self-pay | Admitting: Family Medicine

## 2014-06-11 ENCOUNTER — Other Ambulatory Visit: Payer: Self-pay | Admitting: Family Medicine

## 2014-06-18 ENCOUNTER — Other Ambulatory Visit: Payer: Self-pay | Admitting: Family Medicine

## 2014-07-06 ENCOUNTER — Ambulatory Visit (INDEPENDENT_AMBULATORY_CARE_PROVIDER_SITE_OTHER): Payer: Medicare Other | Admitting: Family Medicine

## 2014-07-06 ENCOUNTER — Encounter: Payer: Self-pay | Admitting: Family Medicine

## 2014-07-06 VITALS — BP 128/74 | HR 65 | Temp 97.7°F | Ht 60.75 in | Wt 114.8 lb

## 2014-07-06 DIAGNOSIS — I10 Essential (primary) hypertension: Secondary | ICD-10-CM

## 2014-07-06 DIAGNOSIS — Z0000 Encounter for general adult medical examination without abnormal findings: Secondary | ICD-10-CM

## 2014-07-06 DIAGNOSIS — E782 Mixed hyperlipidemia: Secondary | ICD-10-CM

## 2014-07-06 DIAGNOSIS — E039 Hypothyroidism, unspecified: Secondary | ICD-10-CM

## 2014-07-06 DIAGNOSIS — E538 Deficiency of other specified B group vitamins: Secondary | ICD-10-CM

## 2014-07-06 DIAGNOSIS — E785 Hyperlipidemia, unspecified: Secondary | ICD-10-CM

## 2014-07-06 DIAGNOSIS — H409 Unspecified glaucoma: Secondary | ICD-10-CM

## 2014-07-06 DIAGNOSIS — M810 Age-related osteoporosis without current pathological fracture: Secondary | ICD-10-CM

## 2014-07-06 HISTORY — DX: Encounter for general adult medical examination without abnormal findings: Z00.00

## 2014-07-06 LAB — T4, FREE: Free T4: 1.32 ng/dL (ref 0.60–1.60)

## 2014-07-06 LAB — COMPREHENSIVE METABOLIC PANEL
ALBUMIN: 4 g/dL (ref 3.5–5.2)
ALT: 20 U/L (ref 0–35)
AST: 20 U/L (ref 0–37)
Alkaline Phosphatase: 51 U/L (ref 39–117)
BILIRUBIN TOTAL: 0.9 mg/dL (ref 0.2–1.2)
BUN: 18 mg/dL (ref 6–23)
CO2: 29 mEq/L (ref 19–32)
Calcium: 9.8 mg/dL (ref 8.4–10.5)
Chloride: 100 mEq/L (ref 96–112)
Creatinine, Ser: 0.9 mg/dL (ref 0.4–1.2)
GFR: 67.35 mL/min (ref >60.00)
GLUCOSE: 88 mg/dL (ref 70–99)
POTASSIUM: 3.9 meq/L (ref 3.5–5.1)
SODIUM: 136 meq/L (ref 135–145)
TOTAL PROTEIN: 7.1 g/dL (ref 6.0–8.3)

## 2014-07-06 LAB — TSH: TSH: 0.36 u[IU]/mL (ref 0.35–4.50)

## 2014-07-06 LAB — CBC
HCT: 44.5 % (ref 36.0–46.0)
HEMOGLOBIN: 14.9 g/dL (ref 12.0–15.0)
MCHC: 33.6 g/dL (ref 30.0–36.0)
MCV: 94.3 fl (ref 78.0–100.0)
Platelets: 291 10*3/uL (ref 150.0–400.0)
RBC: 4.72 Mil/uL (ref 3.87–5.11)
RDW: 14.5 % (ref 11.5–15.5)
WBC: 7.8 10*3/uL (ref 4.0–10.5)

## 2014-07-06 LAB — LIPID PANEL
Cholesterol: 178 mg/dL (ref 0–200)
HDL: 58.8 mg/dL (ref >39.00)
LDL CALC: 102 mg/dL — AB (ref 0–99)
NONHDL: 119.2
TRIGLYCERIDES: 88 mg/dL (ref 0.0–149.0)
Total CHOL/HDL Ratio: 3
VLDL: 17.6 mg/dL (ref 0.0–40.0)

## 2014-07-06 LAB — VITAMIN D 25 HYDROXY (VIT D DEFICIENCY, FRACTURES): VITD: 44.27 ng/mL (ref 30.00–100.00)

## 2014-07-06 LAB — VITAMIN B12: VITAMIN B 12: 254 pg/mL (ref 211–911)

## 2014-07-06 NOTE — Assessment & Plan Note (Signed)
Tolerating statin, encouraged heart healthy diet, avoid trans fats, minimize simple carbs and saturated fats. Increase exercise as tolerated 

## 2014-07-06 NOTE — Progress Notes (Signed)
Pre visit review using our clinic review tool, if applicable. No additional management support is needed unless otherwise documented below in the visit note. 

## 2014-07-06 NOTE — Assessment & Plan Note (Signed)
Follows with Dr Len Childs, no recent visual changes

## 2014-07-06 NOTE — Patient Instructions (Signed)
Call insurance and ask if they cover the Prevnar/Pneumococcal 13 valent shot. You had the Pneumoncoccal 23 valent shot in 2014  Call my office at [phone] to make a shot only appointment for the Prevnar shot if inusrance will cover or you want to pay cash   Preventive Care for Adults A healthy lifestyle and preventive care can promote health and wellness. Preventive health guidelines for women include the following key practices.  A routine yearly physical is a good way to check with your health care provider about your health and preventive screening. It is a chance to share any concerns and updates on your health and to receive a thorough exam.  Visit your dentist for a routine exam and preventive care every 6 months. Brush your teeth twice a day and floss once a day. Good oral hygiene prevents tooth decay and gum disease.  The frequency of eye exams is based on your age, health, family medical history, use of contact lenses, and other factors. Follow your health care provider's recommendations for frequency of eye exams.  Eat a healthy diet. Foods like vegetables, fruits, whole grains, low-fat dairy products, and lean protein foods contain the nutrients you need without too many calories. Decrease your intake of foods high in solid fats, added sugars, and salt. Eat the right amount of calories for you.Get information about a proper diet from your health care provider, if necessary.  Regular physical exercise is one of the most important things you can do for your health. Most adults should get at least 150 minutes of moderate-intensity exercise (any activity that increases your heart rate and causes you to sweat) each week. In addition, most adults need muscle-strengthening exercises on 2 or more days a week.  Maintain a healthy weight. The body mass index (BMI) is a screening tool to identify possible weight problems. It provides an estimate of body fat based on height and weight. Your health  care provider can find your BMI and can help you achieve or maintain a healthy weight.For adults 20 years and older:  A BMI below 18.5 is considered underweight.  A BMI of 18.5 to 24.9 is normal.  A BMI of 25 to 29.9 is considered overweight.  A BMI of 30 and above is considered obese.  Maintain normal blood lipids and cholesterol levels by exercising and minimizing your intake of saturated fat. Eat a balanced diet with plenty of fruit and vegetables. Blood tests for lipids and cholesterol should begin at age 38 and be repeated every 5 years. If your lipid or cholesterol levels are high, you are over 50, or you are at high risk for heart disease, you may need your cholesterol levels checked more frequently.Ongoing high lipid and cholesterol levels should be treated with medicines if diet and exercise are not working.  If you smoke, find out from your health care provider how to quit. If you do not use tobacco, do not start.  Lung cancer screening is recommended for adults aged 61-80 years who are at high risk for developing lung cancer because of a history of smoking. A yearly low-dose CT scan of the lungs is recommended for people who have at least a 30-pack-year history of smoking and are a current smoker or have quit within the past 15 years. A pack year of smoking is smoking an average of 1 pack of cigarettes a day for 1 year (for example: 1 pack a day for 30 years or 2 packs a day for 15  years). Yearly screening should continue until the smoker has stopped smoking for at least 15 years. Yearly screening should be stopped for people who develop a health problem that would prevent them from having lung cancer treatment.  If you are pregnant, do not drink alcohol. If you are breastfeeding, be very cautious about drinking alcohol. If you are not pregnant and choose to drink alcohol, do not have more than 1 drink per day. One drink is considered to be 12 ounces (355 mL) of beer, 5 ounces (148 mL)  of wine, or 1.5 ounces (44 mL) of liquor.  Avoid use of street drugs. Do not share needles with anyone. Ask for help if you need support or instructions about stopping the use of drugs.  High blood pressure causes heart disease and increases the risk of stroke. Your blood pressure should be checked at least every 1 to 2 years. Ongoing high blood pressure should be treated with medicines if weight loss and exercise do not work.  If you are 27-97 years old, ask your health care provider if you should take aspirin to prevent strokes.  Diabetes screening involves taking a blood sample to check your fasting blood sugar level. This should be done once every 3 years, after age 68, if you are within normal weight and without risk factors for diabetes. Testing should be considered at a younger age or be carried out more frequently if you are overweight and have at least 1 risk factor for diabetes.  Breast cancer screening is essential preventive care for women. You should practice "breast self-awareness." This means understanding the normal appearance and feel of your breasts and may include breast self-examination. Any changes detected, no matter how small, should be reported to a health care provider. Women in their 75s and 30s should have a clinical breast exam (CBE) by a health care provider as part of a regular health exam every 1 to 3 years. After age 62, women should have a CBE every year. Starting at age 15, women should consider having a mammogram (breast X-ray test) every year. Women who have a family history of breast cancer should talk to their health care provider about genetic screening. Women at a high risk of breast cancer should talk to their health care providers about having an MRI and a mammogram every year.  Breast cancer gene (BRCA)-related cancer risk assessment is recommended for women who have family members with BRCA-related cancers. BRCA-related cancers include breast, ovarian, tubal,  and peritoneal cancers. Having family members with these cancers may be associated with an increased risk for harmful changes (mutations) in the breast cancer genes BRCA1 and BRCA2. Results of the assessment will determine the need for genetic counseling and BRCA1 and BRCA2 testing.  Routine pelvic exams to screen for cancer are no longer recommended for nonpregnant women who are considered low risk for cancer of the pelvic organs (ovaries, uterus, and vagina) and who do not have symptoms. Ask your health care provider if a screening pelvic exam is right for you.  If you have had past treatment for cervical cancer or a condition that could lead to cancer, you need Pap tests and screening for cancer for at least 20 years after your treatment. If Pap tests have been discontinued, your risk factors (such as having a new sexual partner) need to be reassessed to determine if screening should be resumed. Some women have medical problems that increase the chance of getting cervical cancer. In these cases, your health care  provider may recommend more frequent screening and Pap tests.  The HPV test is an additional test that may be used for cervical cancer screening. The HPV test looks for the virus that can cause the cell changes on the cervix. The cells collected during the Pap test can be tested for HPV. The HPV test could be used to screen women aged 55 years and older, and should be used in women of any age who have unclear Pap test results. After the age of 72, women should have HPV testing at the same frequency as a Pap test.  Colorectal cancer can be detected and often prevented. Most routine colorectal cancer screening begins at the age of 85 years and continues through age 79 years. However, your health care provider may recommend screening at an earlier age if you have risk factors for colon cancer. On a yearly basis, your health care provider may provide home test kits to check for hidden blood in the  stool. Use of a small camera at the end of a tube, to directly examine the colon (sigmoidoscopy or colonoscopy), can detect the earliest forms of colorectal cancer. Talk to your health care provider about this at age 43, when routine screening begins. Direct exam of the colon should be repeated every 5-10 years through age 60 years, unless early forms of pre-cancerous polyps or small growths are found.  People who are at an increased risk for hepatitis B should be screened for this virus. You are considered at high risk for hepatitis B if:  You were born in a country where hepatitis B occurs often. Talk with your health care provider about which countries are considered high risk.  Your parents were born in a high-risk country and you have not received a shot to protect against hepatitis B (hepatitis B vaccine).  You have HIV or AIDS.  You use needles to inject street drugs.  You live with, or have sex with, someone who has hepatitis B.  You get hemodialysis treatment.  You take certain medicines for conditions like cancer, organ transplantation, and autoimmune conditions.  Hepatitis C blood testing is recommended for all people born from [birth year] through 1965 and any individual with known risks for hepatitis C.  Practice safe sex. Use condoms and avoid high-risk sexual practices to reduce the spread of sexually transmitted infections (STIs). STIs include gonorrhea, chlamydia, syphilis, trichomonas, herpes, HPV, and human immunodeficiency virus (HIV). Herpes, HIV, and HPV are viral illnesses that have no cure. They can result in disability, cancer, and death.  You should be screened for sexually transmitted illnesses (STIs) including gonorrhea and chlamydia if:  You are sexually active and are younger than 24 years.  You are older than 24 years and your health care provider tells you that you are at risk for this type of infection.  Your sexual activity has changed since you were last  screened and you are at an increased risk for chlamydia or gonorrhea. Ask your health care provider if you are at risk.  If you are at risk of being infected with HIV, it is recommended that you take a prescription medicine daily to prevent HIV infection. This is called preexposure prophylaxis (PrEP). You are considered at risk if:  You are a heterosexual woman, are sexually active, and are at increased risk for HIV infection.  You take drugs by injection.  You are sexually active with a partner who has HIV.  Talk with your health care provider about whether you are  at high risk of being infected with HIV. If you choose to begin PrEP, you should first be tested for HIV. You should then be tested every 3 months for as long as you are taking PrEP.  Osteoporosis is a disease in which the bones lose minerals and strength with aging. This can result in serious bone fractures or breaks. The risk of osteoporosis can be identified using a bone density scan. Women ages 9 years and over and women at risk for fractures or osteoporosis should discuss screening with their health care providers. Ask your health care provider whether you should take a calcium supplement or vitamin D to reduce the rate of osteoporosis.  Menopause can be associated with physical symptoms and risks. Hormone replacement therapy is available to decrease symptoms and risks. You should talk to your health care provider about whether hormone replacement therapy is right for you.  Use sunscreen. Apply sunscreen liberally and repeatedly throughout the day. You should seek shade when your shadow is shorter than you. Protect yourself by wearing long sleeves, pants, a wide-brimmed hat, and sunglasses year round, whenever you are outdoors.  Once a month, do a whole body skin exam, using a mirror to look at the skin on your back. Tell your health care provider of new moles, moles that have irregular borders, moles that are larger than a pencil  eraser, or moles that have changed in shape or color.  Stay current with required vaccines (immunizations).  Influenza vaccine. All adults should be immunized every year.  Tetanus, diphtheria, and acellular pertussis (Td, Tdap) vaccine. Pregnant women should receive 1 dose of Tdap vaccine during each pregnancy. The dose should be obtained regardless of the length of time since the last dose. Immunization is preferred during the 27th-36th week of gestation. An adult who has not previously received Tdap or who does not know her vaccine status should receive 1 dose of Tdap. This initial dose should be followed by tetanus and diphtheria toxoids (Td) booster doses every 10 years. Adults with an unknown or incomplete history of completing a 3-dose immunization series with Td-containing vaccines should begin or complete a primary immunization series including a Tdap dose. Adults should receive a Td booster every 10 years.  Varicella vaccine. An adult without evidence of immunity to varicella should receive 2 doses or a second dose if she has previously received 1 dose. Pregnant females who do not have evidence of immunity should receive the first dose after pregnancy. This first dose should be obtained before leaving the health care facility. The second dose should be obtained 4-8 weeks after the first dose.  Human papillomavirus (HPV) vaccine. Females aged 13-26 years who have not received the vaccine previously should obtain the 3-dose series. The vaccine is not recommended for use in pregnant females. However, pregnancy testing is not needed before receiving a dose. If a female is found to be pregnant after receiving a dose, no treatment is needed. In that case, the remaining doses should be delayed until after the pregnancy. Immunization is recommended for any person with an immunocompromised condition through the age of 5 years if she did not get any or all doses earlier. During the 3-dose series, the  second dose should be obtained 4-8 weeks after the first dose. The third dose should be obtained 24 weeks after the first dose and 16 weeks after the second dose.  Zoster vaccine. One dose is recommended for adults aged 65 years or older unless certain conditions are present.  Measles, mumps, and rubella (MMR) vaccine. Adults born before 1957 generally are considered immune to measles and mumps. Adults born in 1957 or later should have 1 or more doses of MMR vaccine unless there is a contraindication to the vaccine or there is laboratory evidence of immunity to each of the three diseases. A routine second dose of MMR vaccine should be obtained at least 28 days after the first dose for students attending postsecondary schools, health care workers, or international travelers. People who received inactivated measles vaccine or an unknown type of measles vaccine during 1963-1967 should receive 2 doses of MMR vaccine. People who received inactivated mumps vaccine or an unknown type of mumps vaccine before 1979 and are at high risk for mumps infection should consider immunization with 2 doses of MMR vaccine. For females of childbearing age, rubella immunity should be determined. If there is no evidence of immunity, females who are not pregnant should be vaccinated. If there is no evidence of immunity, females who are pregnant should delay immunization until after pregnancy. Unvaccinated health care workers born before 1957 who lack laboratory evidence of measles, mumps, or rubella immunity or laboratory confirmation of disease should consider measles and mumps immunization with 2 doses of MMR vaccine or rubella immunization with 1 dose of MMR vaccine.  Pneumococcal 13-valent conjugate (PCV13) vaccine. When indicated, a person who is uncertain of her immunization history and has no record of immunization should receive the PCV13 vaccine. An adult aged 19 years or older who has certain medical conditions and has not  been previously immunized should receive 1 dose of PCV13 vaccine. This PCV13 should be followed with a dose of pneumococcal polysaccharide (PPSV23) vaccine. The PPSV23 vaccine dose should be obtained at least 8 weeks after the dose of PCV13 vaccine. An adult aged 19 years or older who has certain medical conditions and previously received 1 or more doses of PPSV23 vaccine should receive 1 dose of PCV13. The PCV13 vaccine dose should be obtained 1 or more years after the last PPSV23 vaccine dose.  Pneumococcal polysaccharide (PPSV23) vaccine. When PCV13 is also indicated, PCV13 should be obtained first. All adults aged 65 years and older should be immunized. An adult younger than age 65 years who has certain medical conditions should be immunized. Any person who resides in a nursing home or long-term care facility should be immunized. An adult smoker should be immunized. People with an immunocompromised condition and certain other conditions should receive both PCV13 and PPSV23 vaccines. People with human immunodeficiency virus (HIV) infection should be immunized as soon as possible after diagnosis. Immunization during chemotherapy or radiation therapy should be avoided. Routine use of PPSV23 vaccine is not recommended for American Indians, [place] Natives, or people younger than 65 years unless there are medical conditions that require PPSV23 vaccine. When indicated, people who have unknown immunization and have no record of immunization should receive PPSV23 vaccine. One-time revaccination 5 years after the first dose of PPSV23 is recommended for people aged 19-64 years who have chronic kidney failure, nephrotic syndrome, asplenia, or immunocompromised conditions. People who received 1-2 doses of PPSV23 before age 65 years should receive another dose of PPSV23 vaccine at age 65 years or later if at least 5 years have passed since the previous dose. Doses of PPSV23 are not needed for people immunized with PPSV23 at  or after age 65 years.  Meningococcal vaccine. Adults with asplenia or persistent complement component deficiencies should receive 2 doses of quadrivalent meningococcal conjugate (MenACWY-D) vaccine.   The doses should be obtained at least 2 months apart. Microbiologists working with certain meningococcal bacteria, [place] recruits, people at risk during an outbreak, and people who travel to or live in countries with a high rate of meningitis should be immunized. A first-year college student up through age 29 years who is living in a residence hall should receive a dose if she did not receive a dose on or after her 16th birthday. Adults who have certain high-risk conditions should receive one or more doses of vaccine.  Hepatitis A vaccine. Adults who wish to be protected from this disease, have certain high-risk conditions, work with hepatitis A-infected animals, work in hepatitis A research labs, or travel to or work in countries with a high rate of hepatitis A should be immunized. Adults who were previously unvaccinated and who anticipate close contact with an international adoptee during the first 60 days after arrival in the [place] from a country with a high rate of hepatitis A should be immunized.  Hepatitis B vaccine. Adults who wish to be protected from this disease, have certain high-risk conditions, may be exposed to blood or other infectious body fluids, are household contacts or sex partners of hepatitis B positive people, are clients or workers in certain care facilities, or travel to or work in countries with a high rate of hepatitis B should be immunized.  Haemophilus influenzae type b (Hib) vaccine. A previously unvaccinated person with asplenia or sickle cell disease or having a scheduled splenectomy should receive 1 dose of Hib vaccine. Regardless of previous immunization, a recipient of a hematopoietic stem cell transplant should receive a 3-dose series 6-12 months after her  successful transplant. Hib vaccine is not recommended for adults with HIV infection. Preventive Services / Frequency Ages 52 to 38 years  Blood pressure check.** / Every 1 to 2 years.  Lipid and cholesterol check.** / Every 5 years beginning at age 16.  Clinical breast exam.** / Every 3 years for women in their 41s and 77s.  BRCA-related cancer risk assessment.** / For women who have family members with a BRCA-related cancer (breast, ovarian, tubal, or peritoneal cancers).  Pap test.** / Every 2 years from ages 17 through 74. Every 3 years starting at age 11 through age 69 or 61 with a history of 3 consecutive normal Pap tests.  HPV screening.** / Every 3 years from ages 12 through ages 3 to 1 with a history of 3 consecutive normal Pap tests.  Hepatitis C blood test.** / For any individual with known risks for hepatitis C.  Skin self-exam. / Monthly.  Influenza vaccine. / Every year.  Tetanus, diphtheria, and acellular pertussis (Tdap, Td) vaccine.** / Consult your health care provider. Pregnant women should receive 1 dose of Tdap vaccine during each pregnancy. 1 dose of Td every 10 years.  Varicella vaccine.** / Consult your health care provider. Pregnant females who do not have evidence of immunity should receive the first dose after pregnancy.  HPV vaccine. / 3 doses over 6 months, if 30 and younger. The vaccine is not recommended for use in pregnant females. However, pregnancy testing is not needed before receiving a dose.  Measles, mumps, rubella (MMR) vaccine.** / You need at least 1 dose of MMR if you were born in 1957 or later. You may also need a 2nd dose. For females of childbearing age, rubella immunity should be determined. If there is no evidence of immunity, females who are not pregnant should be vaccinated. If there is  no evidence of immunity, females who are pregnant should delay immunization until after pregnancy.  Pneumococcal 13-valent conjugate (PCV13) vaccine.** /  Consult your health care provider.  Pneumococcal polysaccharide (PPSV23) vaccine.** / 1 to 2 doses if you smoke cigarettes or if you have certain conditions.  Meningococcal vaccine.** / 1 dose if you are age 19 to 21 years and a first-year college student living in a residence hall, or have one of several medical conditions, you need to get vaccinated against meningococcal disease. You may also need additional booster doses.  Hepatitis A vaccine.** / Consult your health care provider.  Hepatitis B vaccine.** / Consult your health care provider.  Haemophilus influenzae type b (Hib) vaccine.** / Consult your health care provider. Ages 40 to 64 years  Blood pressure check.** / Every 1 to 2 years.  Lipid and cholesterol check.** / Every 5 years beginning at age 20 years.  Lung cancer screening. / Every year if you are aged 55-80 years and have a 30-pack-year history of smoking and currently smoke or have quit within the past 15 years. Yearly screening is stopped once you have quit smoking for at least 15 years or develop a health problem that would prevent you from having lung cancer treatment.  Clinical breast exam.** / Every year after age 40 years.  BRCA-related cancer risk assessment.** / For women who have family members with a BRCA-related cancer (breast, ovarian, tubal, or peritoneal cancers).  Mammogram.** / Every year beginning at age 40 years and continuing for as long as you are in good health. Consult with your health care provider.  Pap test.** / Every 3 years starting at age 30 years through age 65 or 70 years with a history of 3 consecutive normal Pap tests.  HPV screening.** / Every 3 years from ages 30 years through ages 65 to 70 years with a history of 3 consecutive normal Pap tests.  Fecal occult blood test (FOBT) of stool. / Every year beginning at age 50 years and continuing until age 75 years. You may not need to do this test if you get a colonoscopy every 10  years.  Flexible sigmoidoscopy or colonoscopy.** / Every 5 years for a flexible sigmoidoscopy or every 10 years for a colonoscopy beginning at age 50 years and continuing until age 75 years.  Hepatitis C blood test.** / For all people born from 1945 through 1965 and any individual with known risks for hepatitis C.  Skin self-exam. / Monthly.  Influenza vaccine. / Every year.  Tetanus, diphtheria, and acellular pertussis (Tdap/Td) vaccine.** / Consult your health care provider. Pregnant women should receive 1 dose of Tdap vaccine during each pregnancy. 1 dose of Td every 10 years.  Varicella vaccine.** / Consult your health care provider. Pregnant females who do not have evidence of immunity should receive the first dose after pregnancy.  Zoster vaccine.** / 1 dose for adults aged 60 years or older.  Measles, mumps, rubella (MMR) vaccine.** / You need at least 1 dose of MMR if you were born in 1957 or later. You may also need a 2nd dose. For females of childbearing age, rubella immunity should be determined. If there is no evidence of immunity, females who are not pregnant should be vaccinated. If there is no evidence of immunity, females who are pregnant should delay immunization until after pregnancy.  Pneumococcal 13-valent conjugate (PCV13) vaccine.** / Consult your health care provider.  Pneumococcal polysaccharide (PPSV23) vaccine.** / 1 to 2 doses if you smoke cigarettes   or if you have certain conditions.  Meningococcal vaccine.** / Consult your health care provider.  Hepatitis A vaccine.** / Consult your health care provider.  Hepatitis B vaccine.** / Consult your health care provider.  Haemophilus influenzae type b (Hib) vaccine.** / Consult your health care provider. Ages 47 years and over  Blood pressure check.** / Every 1 to 2 years.  Lipid and cholesterol check.** / Every 5 years beginning at age 48 years.  Lung cancer screening. / Every year if you are aged 68-80 years  and have a 30-pack-year history of smoking and currently smoke or have quit within the past 15 years. Yearly screening is stopped once you have quit smoking for at least 15 years or develop a health problem that would prevent you from having lung cancer treatment.  Clinical breast exam.** / Every year after age 60 years.  BRCA-related cancer risk assessment.** / For women who have family members with a BRCA-related cancer (breast, ovarian, tubal, or peritoneal cancers).  Mammogram.** / Every year beginning at age 64 years and continuing for as long as you are in good health. Consult with your health care provider.  Pap test.** / Every 3 years starting at age 15 years through age 14 or 23 years with 3 consecutive normal Pap tests. Testing can be stopped between 65 and 70 years with 3 consecutive normal Pap tests and no abnormal Pap or HPV tests in the past 10 years.  HPV screening.** / Every 3 years from ages 35 years through ages 29 or 55 years with a history of 3 consecutive normal Pap tests. Testing can be stopped between 65 and 70 years with 3 consecutive normal Pap tests and no abnormal Pap or HPV tests in the past 10 years.  Fecal occult blood test (FOBT) of stool. / Every year beginning at age 72 years and continuing until age 46 years. You may not need to do this test if you get a colonoscopy every 10 years.  Flexible sigmoidoscopy or colonoscopy.** / Every 5 years for a flexible sigmoidoscopy or every 10 years for a colonoscopy beginning at age 48 years and continuing until age 50 years.  Hepatitis C blood test.** / For all people born from [birth year] through 1963 and any individual with known risks for hepatitis C.  Osteoporosis screening.** / A one-time screening for women ages 25 years and over and women at risk for fractures or osteoporosis.  Skin self-exam. / Monthly.  Influenza vaccine. / Every year.  Tetanus, diphtheria, and acellular pertussis (Tdap/Td) vaccine.** / 1 dose of Td  every 10 years.  Varicella vaccine.** / Consult your health care provider.  Zoster vaccine.** / 1 dose for adults aged 76 years or older.  Pneumococcal 13-valent conjugate (PCV13) vaccine.** / Consult your health care provider.  Pneumococcal polysaccharide (PPSV23) vaccine.** / 1 dose for all adults aged 3 years and older.  Meningococcal vaccine.** / Consult your health care provider.  Hepatitis A vaccine.** / Consult your health care provider.  Hepatitis B vaccine.** / Consult your health care provider.  Haemophilus influenzae type b (Hib) vaccine.** / Consult your health care provider. ** Family history and personal history of risk and conditions may change your health care provider's recommendations. Document Released: 08/14/2001 Document Revised: 11/02/2013 Document Reviewed: 11/13/2010 [facility] Patient Information 2015 [place], [place]. This information is not intended to replace advice given to you by your health care provider. Make sure you discuss any questions you have with your health care provider.

## 2014-07-06 NOTE — Assessment & Plan Note (Signed)
Recheck Vitamin D today and continue supplements. Stay active and repeat Dexa scan at 2-5 year intervals

## 2014-07-06 NOTE — Assessment & Plan Note (Signed)
On Levothyroxine, continue to monitor. TSH suppressed on last 2 blood draws but free T4 wnl. Will recheck today, patient asymptomatic

## 2014-07-06 NOTE — Assessment & Plan Note (Signed)
Patient denies any difficulties at home. No trouble with ADLs, depression or falls. No recent changes to vision or hearing. Is UTD with immunizations. Is UTD with screening. Discussed Advanced Directives, patient agrees to bring [place] copies of documents if can. Encouraged heart healthy diet, exercise as tolerated and adequate sleep. Sees Dr Len Childs of opthamology, [facility] and Dr Berline Chough Dr Marin Olp of oncology [facility] and Dexa scan in 2015 Colonoscopy in 2015 with [facility] gastroenterology Needs repeat colonoscopy this year. No longer does pap smears. Continue with MGMs for now and Dexa scans every 2-5 years. Labs ordered today and return in 6 months

## 2014-07-06 NOTE — Assessment & Plan Note (Signed)
Well controlled, no changes to meds. Encouraged heart healthy diet such as the DASH diet and exercise as tolerated.  °

## 2014-07-06 NOTE — Progress Notes (Signed)
Judy Norman  [id number] 1931/12/18 07/06/2014      Progress Note-Follow Up  Subjective  Chief Complaint  Chief Complaint  Patient presents with  . Annual Exam    fasting    HPI  Patient is a 81 y.o. female in today for routine medical care. She is in today for annual exam. Feels well. No recent illness. Colonoscopy in past has confirmed tubular adenoma and diverticulosis and hemorrhoids but is having no trouble at this time. She continues to follow with ophthalmology but no recent visual changes. No recent illness. Denies CP/palp/SOB/HA/congestion/fevers/GI or GU c/o. Taking meds as prescribed  Past Medical History  Diagnosis Date  . Hypothyroidism   . Hypertension     benign  . Cancer     thyroid  . Adenocarcinoma of breast   . Diverticular disease   . Colon polyps   . Hyperlipidemia   . Glaucoma 07/07/2013  . Preventative health care 07/08/2013  . Medicare annual wellness visit, subsequent 07/06/2014    Past Surgical History  Procedure Laterality Date  . Thyroidectomy    . Breast lumpectomy      Family History  Problem Relation Age of Onset  . Cancer Mother     uterine  . Heart disease Father   . Cancer Brother     ?    History   Social History  . Marital Status: Widowed    Spouse Name: N/A    Number of Children: N/A  . Years of Education: N/A   Occupational History  . housewife    Social History Main Topics  . Smoking status: Never Smoker   . Smokeless tobacco: Never Used     Comment: never used tobacco  . Alcohol Use: No  . Drug Use: No  . Sexual Activity: No   Other Topics Concern  . Not on file   Social History Narrative    Current Outpatient Prescriptions on File Prior to Visit  Medication Sig Dispense Refill  . aspirin 81 MG EC tablet Take 81 mg by mouth daily.      . calcium carbonate (OS-CAL) 600 MG TABS Take 600 mg by mouth 2 (two) times daily.      . enalapril (VASOTEC) 20 MG tablet TAKE 1 TABLET BY MOUTH DAILY 90 tablet 0  .  levothyroxine (SYNTHROID, LEVOTHROID) 88 MCG tablet TAKE 1 TABLET BY MOUTH EVERY DAY 90 tablet 0  . simvastatin (ZOCOR) 40 MG tablet TAKE 1/2 TABLET BY MOUTH EVERY DAY 45 tablet 1  . triamterene-hydrochlorothiazide (MAXZIDE-25) 37.5-25 MG per tablet TAKE 1 TABLET BY MOUTH EVERY DAY 90 tablet 0   No current facility-administered medications on file prior to visit.    Allergies  Allergen Reactions  . Sulfonamide Derivatives     Review of Systems  Review of Systems  Constitutional: Negative for fever, chills and malaise/fatigue.  HENT: Negative for congestion, hearing loss and nosebleeds.   Eyes: Negative for discharge.  Respiratory: Negative for cough, sputum production, shortness of breath and wheezing.   Cardiovascular: Negative for chest pain, palpitations and leg swelling.  Gastrointestinal: Negative for heartburn, nausea, vomiting, abdominal pain, diarrhea, constipation and blood in stool.  Genitourinary: Negative for dysuria, urgency, frequency and hematuria.  Musculoskeletal: Negative for myalgias, back pain and falls.  Skin: Negative for rash.  Neurological: Negative for dizziness, tremors, sensory change, focal weakness, loss of consciousness, weakness and headaches.  Endo/Heme/Allergies: Negative for polydipsia. Does not bruise/bleed easily.  Psychiatric/Behavioral: Negative for depression and suicidal ideas. The patient  is not nervous/anxious and does not have insomnia.     Objective  BP 128/74 mmHg  Pulse 65  Temp(Src) 97.7 F (36.5 C) (Oral)  Ht 5' 0.75" (1.543 m)  Wt 114 lb 12.8 oz (52.073 kg)  BMI 21.87 kg/m2  SpO2 99%  Physical Exam  Physical Exam  Constitutional: She is oriented to person, place, and time and well-developed, well-nourished, and in no distress. No distress.  HENT:  Head: Normocephalic and atraumatic.  Right Ear: External ear normal.  Left Ear: External ear normal.  Nose: Nose normal.  Mouth/Throat: Oropharynx is clear and moist. No  oropharyngeal exudate.  Eyes: Conjunctivae are normal. Pupils are equal, round, and reactive to light. Right eye exhibits no discharge. Left eye exhibits no discharge. No scleral icterus.  Neck: Normal range of motion. Neck supple. No thyromegaly present.  Cardiovascular: Normal rate, regular rhythm, normal heart sounds and intact distal pulses.   No murmur heard. Pulmonary/Chest: Effort normal and breath sounds normal. No respiratory distress. She has no wheezes. She has no rales.  Abdominal: Soft. Bowel sounds are normal. She exhibits no distension and no mass. There is no tenderness.  Musculoskeletal: Normal range of motion. She exhibits no edema or tenderness.  Lymphadenopathy:    She has no cervical adenopathy.  Neurological: She is alert and oriented to person, place, and time. She has normal reflexes. No cranial nerve deficit. Coordination normal.  Skin: Skin is warm and dry. No rash noted. She is not diaphoretic.  Psychiatric: Mood, memory and affect normal.    Lab Results  Component Value Date   TSH 0.205* 04/01/2014   Lab Results  Component Value Date   WBC 6.6 04/01/2014   HGB 14.2 04/01/2014   HCT 41.1 04/01/2014   MCV 94 04/01/2014   PLT 262 04/01/2014   Lab Results  Component Value Date   CREATININE 0.97 04/01/2014   BUN 18 04/01/2014   NA 133* 04/01/2014   K 3.9 04/01/2014   CL 97 04/01/2014   CO2 27 04/01/2014   Lab Results  Component Value Date   ALT 16 04/01/2014   AST 18 04/01/2014   ALKPHOS 48 04/01/2014   BILITOT 0.6 04/01/2014   Lab Results  Component Value Date   CHOL 165 12/28/2013   Lab Results  Component Value Date   HDL 57 12/28/2013   Lab Results  Component Value Date   LDLCALC 91 12/28/2013   Lab Results  Component Value Date   TRIG 86 12/28/2013   Lab Results  Component Value Date   CHOLHDL 2.9 12/28/2013     Assessment & Plan  HYPERTENSION, BENIGN Well controlled, no changes to meds. Encouraged heart healthy diet such  as the DASH diet and exercise as tolerated.   Hypothyroidism On Levothyroxine, continue to monitor. TSH suppressed on last 2 blood draws but free T4 wnl. Will recheck today, patient asymptomatic  Osteoporosis Recheck Vitamin D today and continue supplements. Stay active and repeat Dexa scan at 2-5 year intervals  Hyperlipidemia, mixed Tolerating statin, encouraged heart healthy diet, avoid trans fats, minimize simple carbs and saturated fats. Increase exercise as tolerated  Glaucoma Follows with Dr Len Childs, no recent visual changes  Medicare annual wellness visit, subsequent Patient denies any difficulties at home. No trouble with ADLs, depression or falls. No recent changes to vision or hearing. Is UTD with immunizations. Is UTD with screening. Discussed Advanced Directives, patient agrees to bring [place] copies of documents if can. Encouraged heart healthy diet, exercise as tolerated  and adequate sleep. Sees Dr Len Childs of opthamology, [facility] and Dr Berline Chough Dr Marin Olp of oncology [facility] and Dexa scan in 2015 Colonoscopy in 2015 with [facility] gastroenterology Needs repeat colonoscopy this year. No longer does pap smears. Continue with MGMs for now and Dexa scans every 2-5 years. Labs ordered today and return in 6 months

## 2014-07-09 ENCOUNTER — Telehealth: Payer: Self-pay | Admitting: Family Medicine

## 2014-07-09 NOTE — Telephone Encounter (Signed)
Pt needs medicare to be called and to see if insurance will cover Prevnar. Please advise

## 2014-07-12 NOTE — Telephone Encounter (Signed)
Unfortunately, I do not handle Prevnar, only Prolia. But I did look it up on Medicare.gov for the patient. This is what MCR's website says:  How often is it covered? Part B covers certain GOVPCHE&#03; services, outpatient care, medical supplies, and preventive services. " class="glossary" href="#1368"Medicare Part B [employer]) covers a pneumococcal shot to prevent pneumococcal infections (like certain types of pneumonia). Part B also covers a different second shot one year later. Talk with your doctor or other health care provider to see if you need these shots. Who's eligible? All people with Part B are covered. Your costs in Original Medicare You pay nothing for pneumococcal shots if your doctor or other qualified health care provider accepts An agreement by your doctor, provider, or supplier to be paid directly by Medicare, to accept the payment amount Medicare approves for the service, and not to bill you for any more than the Medicare deductible and coinsurance. " class="glossary" href="#1286"assignment.  I hope this helps, but if additional info is needed, pt will need to contact [facility] directly herself. Thank you.

## 2014-07-12 NOTE — Telephone Encounter (Signed)
Left message on voicemail to return call.

## 2014-07-14 ENCOUNTER — Ambulatory Visit (INDEPENDENT_AMBULATORY_CARE_PROVIDER_SITE_OTHER): Payer: Medicare Other

## 2014-07-14 DIAGNOSIS — Z23 Encounter for immunization: Secondary | ICD-10-CM

## 2014-07-14 NOTE — Progress Notes (Signed)
Pre visit review using our clinic review tool, if applicable. No additional management support is needed unless otherwise documented below in the visit note. Patient tolerated well.  

## 2014-07-21 ENCOUNTER — Other Ambulatory Visit: Payer: Self-pay | Admitting: Family Medicine

## 2014-07-22 ENCOUNTER — Telehealth: Payer: Self-pay | Admitting: *Deleted

## 2014-07-22 NOTE — Telephone Encounter (Signed)
-----   Message from Mosie Lukes, MD sent at 07/06/2014 10:39 AM EST ----- Please let her know when I reviewed her chart more, her last colonoscopy was with [facility] in 2012 and they found a Tubular Adenoma and they wanted to repeat this in 3 years so she is due. She can call them or I can place referral her choice. Let me know

## 2014-07-22 NOTE — Telephone Encounter (Signed)
Returning call please call back at [phone] Or [phone]

## 2014-07-22 NOTE — Telephone Encounter (Signed)
LMOM @ 11:35am @ [phone]) asking the pt to RTC regarding note below.//AB/CMA

## 2014-07-26 NOTE — Telephone Encounter (Signed)
Called and spoke with the pt and informed her of Dr. Frederik Pear note below.  Pt understood and agreed.  Pt stated that she will need to discuss this with her son, and then she will get back with us.//AB/CMA

## 2014-08-12 ENCOUNTER — Other Ambulatory Visit: Payer: Self-pay | Admitting: Family Medicine

## 2014-09-10 ENCOUNTER — Other Ambulatory Visit: Payer: Self-pay | Admitting: Family Medicine

## 2014-12-08 ENCOUNTER — Other Ambulatory Visit: Payer: Self-pay | Admitting: Family Medicine

## 2014-12-17 ENCOUNTER — Other Ambulatory Visit: Payer: Self-pay | Admitting: Family Medicine

## 2015-01-13 ENCOUNTER — Encounter: Payer: Self-pay | Admitting: Family Medicine

## 2015-01-13 ENCOUNTER — Ambulatory Visit (INDEPENDENT_AMBULATORY_CARE_PROVIDER_SITE_OTHER): Payer: Medicare Other | Admitting: Family Medicine

## 2015-01-13 VITALS — BP 118/74 | HR 81 | Temp 97.5°F | Ht 62.0 in | Wt 115.2 lb

## 2015-01-13 DIAGNOSIS — M810 Age-related osteoporosis without current pathological fracture: Secondary | ICD-10-CM

## 2015-01-13 DIAGNOSIS — I10 Essential (primary) hypertension: Secondary | ICD-10-CM | POA: Diagnosis not present

## 2015-01-13 DIAGNOSIS — E782 Mixed hyperlipidemia: Secondary | ICD-10-CM | POA: Diagnosis not present

## 2015-01-13 DIAGNOSIS — E876 Hypokalemia: Secondary | ICD-10-CM

## 2015-01-13 DIAGNOSIS — E038 Other specified hypothyroidism: Secondary | ICD-10-CM | POA: Diagnosis not present

## 2015-01-13 DIAGNOSIS — E538 Deficiency of other specified B group vitamins: Secondary | ICD-10-CM | POA: Diagnosis not present

## 2015-01-13 LAB — COMPREHENSIVE METABOLIC PANEL
ALBUMIN: 4 g/dL (ref 3.5–5.2)
ALT: 15 U/L (ref 0–35)
AST: 20 U/L (ref 0–37)
Alkaline Phosphatase: 54 U/L (ref 39–117)
BUN: 18 mg/dL (ref 6–23)
CALCIUM: 9.9 mg/dL (ref 8.4–10.5)
CHLORIDE: 96 meq/L (ref 96–112)
CO2: 31 meq/L (ref 19–32)
Creatinine, Ser: 0.88 mg/dL (ref 0.40–1.20)
GFR: 65.5 mL/min (ref >60.00)
Glucose, Bld: 80 mg/dL (ref 70–99)
Potassium: 3.8 mEq/L (ref 3.5–5.1)
Sodium: 133 mEq/L — ABNORMAL LOW (ref 135–145)
TOTAL PROTEIN: 7.2 g/dL (ref 6.0–8.3)
Total Bilirubin: 0.6 mg/dL (ref 0.2–1.2)

## 2015-01-13 LAB — CBC
HCT: 43.8 % (ref 36.0–46.0)
HEMOGLOBIN: 14.6 g/dL (ref 12.0–15.0)
MCHC: 33.3 g/dL (ref 30.0–36.0)
MCV: 93.8 fl (ref 78.0–100.0)
Platelets: 299 10*3/uL (ref 150.0–400.0)
RBC: 4.67 Mil/uL (ref 3.87–5.11)
RDW: 14.1 % (ref 11.5–15.5)
WBC: 7.8 10*3/uL (ref 4.0–10.5)

## 2015-01-13 LAB — LIPID PANEL
CHOL/HDL RATIO: 3
Cholesterol: 172 mg/dL (ref 0–200)
HDL: 57.9 mg/dL (ref >39.00)
LDL Cholesterol: 103 mg/dL — ABNORMAL HIGH (ref 0–99)
NonHDL: 114.1
TRIGLYCERIDES: 58 mg/dL (ref 0.0–149.0)
VLDL: 11.6 mg/dL (ref 0.0–40.0)

## 2015-01-13 LAB — VITAMIN D 25 HYDROXY (VIT D DEFICIENCY, FRACTURES): VITD: 46.46 ng/mL (ref 30.00–100.00)

## 2015-01-13 LAB — VITAMIN B12: Vitamin B-12: 281 pg/mL (ref 211–911)

## 2015-01-13 LAB — TSH: TSH: 0.36 u[IU]/mL (ref 0.35–4.50)

## 2015-01-13 MED ORDER — LEVOTHYROXINE SODIUM 88 MCG PO TABS: 88.0000 ug | ORAL_TABLET | Freq: Every day | ORAL | Status: DC

## 2015-01-13 MED ORDER — BACITRACIN-POLYMYXIN B 500-10000 UNIT/GM OP OINT: 1.0000 "application " | TOPICAL_OINTMENT | Freq: Three times a day (TID) | OPHTHALMIC | Status: DC

## 2015-01-13 NOTE — Progress Notes (Signed)
Pre visit review using our clinic review tool, if applicable. No additional management support is needed unless otherwise documented below in the visit note. 

## 2015-01-13 NOTE — Progress Notes (Signed)
Judy Norman  [id number] 05-12-1932 01/13/2015      Progress Note-Follow Up  Subjective  Chief Complaint  Chief Complaint  Patient presents with  . Follow-up    HPI  Patient is a 81 y.o. female in today for routine medical care. She is doing well. No recent illness. No acute concerns. No trips to the hospital. Denies CP/palp/SOB/HA/congestion/fevers/GI or GU c/o. Taking meds as prescribed  Past Medical History  Diagnosis Date  . Hypothyroidism   . Hypertension     benign  . Cancer     thyroid  . Adenocarcinoma of breast   . Diverticular disease   . Colon polyps   . Hyperlipidemia   . Glaucoma 07/07/2013  . Preventative health care 07/08/2013  . Medicare annual wellness visit, subsequent 07/06/2014    Past Surgical History  Procedure Laterality Date  . Thyroidectomy    . Breast lumpectomy      Family History  Problem Relation Age of Onset  . Cancer Mother     uterine  . Heart disease Father   . Cancer Brother     ?    History   Social History  . Marital Status: Widowed    Spouse Name: N/A  . Number of Children: N/A  . Years of Education: N/A   Occupational History  . housewife    Social History Main Topics  . Smoking status: Never Smoker   . Smokeless tobacco: Never Used     Comment: never used tobacco  . Alcohol Use: No  . Drug Use: No  . Sexual Activity: No   Other Topics Concern  . Not on file   Social History Narrative    Current Outpatient Prescriptions on File Prior to Visit  Medication Sig Dispense Refill  . aspirin 81 MG EC tablet Take 81 mg by mouth daily.      . calcium carbonate (OS-CAL) 600 MG TABS Take 600 mg by mouth 2 (two) times daily.      . cholecalciferol (VITAMIN D) 1000 UNITS tablet Take 1,000 Units by mouth daily.    . enalapril (VASOTEC) 20 MG tablet TAKE 1 TABLET BY MOUTH DAILY 90 tablet 0  . simvastatin (ZOCOR) 40 MG tablet TAKE 1/2 TABLET BY MOUTH EVERY DAY 45 tablet 2  . triamterene-hydrochlorothiazide  (MAXZIDE-25) 37.5-25 MG per tablet TAKE 1 TABLET BY MOUTH EVERY DAY 90 tablet 3   No current facility-administered medications on file prior to visit.    Allergies  Allergen Reactions  . Sulfonamide Derivatives     Review of Systems  Review of Systems  Constitutional: Negative for fever, chills and malaise/fatigue.  HENT: Negative for congestion, hearing loss and nosebleeds.   Eyes: Negative for discharge.  Respiratory: Negative for cough, sputum production, shortness of breath and wheezing.   Cardiovascular: Negative for chest pain, palpitations and leg swelling.  Gastrointestinal: Negative for heartburn, nausea, vomiting, abdominal pain, diarrhea, constipation and blood in stool.  Genitourinary: Negative for dysuria, urgency, frequency and hematuria.  Musculoskeletal: Negative for myalgias, back pain and falls.  Skin: Negative for rash.  Neurological: Negative for dizziness, tremors, sensory change, focal weakness, loss of consciousness, weakness and headaches.  Endo/Heme/Allergies: Negative for polydipsia. Does not bruise/bleed easily.  Psychiatric/Behavioral: Negative for depression and suicidal ideas. The patient is not nervous/anxious and does not have insomnia.     Objective  BP 118/74 mmHg  Pulse 81  Temp(Src) 97.5 F (36.4 C) (Oral)  Ht 5\' 2"  (1.575 m)  Wt 115 lb 4  oz (52.277 kg)  BMI 21.07 kg/m2  SpO2 97%  Physical Exam  Physical Exam  Lab Results  Component Value Date   TSH 0.36 07/06/2014   Lab Results  Component Value Date   WBC 7.8 07/06/2014   HGB 14.9 07/06/2014   HCT 44.5 07/06/2014   MCV 94.3 07/06/2014   PLT 291.0 07/06/2014   Lab Results  Component Value Date   CREATININE 0.9 07/06/2014   BUN 18 07/06/2014   NA 136 07/06/2014   K 3.9 07/06/2014   CL 100 07/06/2014   CO2 29 07/06/2014   Lab Results  Component Value Date   ALT 20 07/06/2014   AST 20 07/06/2014   ALKPHOS 51 07/06/2014   BILITOT 0.9 07/06/2014   Lab Results    Component Value Date   CHOL 178 07/06/2014   Lab Results  Component Value Date   HDL 58.80 07/06/2014   Lab Results  Component Value Date   LDLCALC 102* 07/06/2014   Lab Results  Component Value Date   TRIG 88.0 07/06/2014   Lab Results  Component Value Date   CHOLHDL 3 07/06/2014     Assessment & Plan  Hypothyroidism On Levothyroxine, continue to monitor  Hyperlipidemia, mixed Encouraged heart healthy diet, increase exercise, avoid trans fats, consider a krill oil cap daily  HYPERTENSION, BENIGN Well controlled, no changes to meds. Encouraged heart healthy diet such as the DASH diet and exercise as tolerated.   Osteoporosis Vitamin D well supplemented continue same, needs calcium tid in diet and supplements and increase exercise as tolerated.

## 2015-01-13 NOTE — Patient Instructions (Signed)

## 2015-01-14 ENCOUNTER — Other Ambulatory Visit: Payer: Self-pay | Admitting: Family Medicine

## 2015-01-14 ENCOUNTER — Telehealth: Payer: Self-pay | Admitting: Family Medicine

## 2015-01-14 DIAGNOSIS — E871 Hypo-osmolality and hyponatremia: Secondary | ICD-10-CM

## 2015-01-14 NOTE — Telephone Encounter (Signed)
In addition I just received a phone call from the patients son who happens to be on her 36 so was ok to speak to him. I read her lab results (per PCP instructions) to him and he completely understood and did state he would explain to his mother.

## 2015-01-14 NOTE — Telephone Encounter (Signed)
I have called this patient this morning to inform her of her lab results/instructions.  She has called back and does not quite understand to decrease po intake by one beverage daily.  Please advise as I am not sure what else I can tell her that she will understand these instructions.(call back number [phone])

## 2015-01-31 NOTE — Assessment & Plan Note (Signed)
Vitamin D well supplemented continue same, needs calcium tid in diet and supplements and increase exercise as tolerated.

## 2015-01-31 NOTE — Assessment & Plan Note (Signed)
On Levothyroxine, continue to monitor 

## 2015-01-31 NOTE — Assessment & Plan Note (Signed)
Well controlled, no changes to meds. Encouraged heart healthy diet such as the DASH diet and exercise as tolerated.  °

## 2015-01-31 NOTE — Assessment & Plan Note (Signed)
Encouraged heart healthy diet, increase exercise, avoid trans fats, consider a krill oil cap daily 

## 2015-02-14 ENCOUNTER — Other Ambulatory Visit (INDEPENDENT_AMBULATORY_CARE_PROVIDER_SITE_OTHER): Payer: Medicare Other

## 2015-02-14 DIAGNOSIS — E871 Hypo-osmolality and hyponatremia: Secondary | ICD-10-CM

## 2015-02-14 LAB — COMPREHENSIVE METABOLIC PANEL
ALBUMIN: 3.9 g/dL (ref 3.5–5.2)
ALK PHOS: 53 U/L (ref 39–117)
ALT: 15 U/L (ref 0–35)
AST: 17 U/L (ref 0–37)
BUN: 18 mg/dL (ref 6–23)
CO2: 33 mEq/L — ABNORMAL HIGH (ref 19–32)
CREATININE: 0.87 mg/dL (ref 0.40–1.20)
Calcium: 9.8 mg/dL (ref 8.4–10.5)
Chloride: 101 mEq/L (ref 96–112)
GFR: 66.36 mL/min (ref >60.00)
Glucose, Bld: 98 mg/dL (ref 70–99)
POTASSIUM: 3.8 meq/L (ref 3.5–5.1)
SODIUM: 140 meq/L (ref 135–145)
Total Bilirubin: 0.5 mg/dL (ref 0.2–1.2)
Total Protein: 7 g/dL (ref 6.0–8.3)

## 2015-03-08 ENCOUNTER — Other Ambulatory Visit: Payer: Self-pay | Admitting: Family Medicine

## 2015-03-08 MED ORDER — ENALAPRIL MALEATE 20 MG PO TABS: 20.0000 mg | ORAL_TABLET | Freq: Every day | ORAL | Status: DC

## 2015-03-29 ENCOUNTER — Telehealth: Payer: Self-pay | Admitting: Hematology & Oncology

## 2015-03-29 NOTE — Telephone Encounter (Signed)
Pt called to cancel 9/29 appt. Will cb to reschedule.

## 2015-03-31 ENCOUNTER — Ambulatory Visit: Payer: Medicare Other | Admitting: Hematology & Oncology

## 2015-03-31 ENCOUNTER — Other Ambulatory Visit: Payer: Medicare Other

## 2015-04-13 ENCOUNTER — Other Ambulatory Visit: Payer: Self-pay

## 2015-04-13 DIAGNOSIS — C73 Malignant neoplasm of thyroid gland: Secondary | ICD-10-CM

## 2015-04-14 ENCOUNTER — Other Ambulatory Visit: Payer: Self-pay | Admitting: Family Medicine

## 2015-04-14 ENCOUNTER — Encounter: Payer: Self-pay | Admitting: Hematology & Oncology

## 2015-04-14 ENCOUNTER — Other Ambulatory Visit (HOSPITAL_BASED_OUTPATIENT_CLINIC_OR_DEPARTMENT_OTHER): Payer: Medicare Other

## 2015-04-14 ENCOUNTER — Ambulatory Visit (HOSPITAL_BASED_OUTPATIENT_CLINIC_OR_DEPARTMENT_OTHER): Payer: Medicare Other | Admitting: Hematology & Oncology

## 2015-04-14 VITALS — BP 141/64 | HR 63 | Temp 97.5°F | Resp 14 | Ht 62.0 in | Wt 119.0 lb

## 2015-04-14 DIAGNOSIS — Z1231 Encounter for screening mammogram for malignant neoplasm of breast: Secondary | ICD-10-CM

## 2015-04-14 DIAGNOSIS — C50012 Malignant neoplasm of nipple and areola, left female breast: Secondary | ICD-10-CM | POA: Diagnosis not present

## 2015-04-14 DIAGNOSIS — C50011 Malignant neoplasm of nipple and areola, right female breast: Secondary | ICD-10-CM | POA: Diagnosis not present

## 2015-04-14 DIAGNOSIS — M810 Age-related osteoporosis without current pathological fracture: Secondary | ICD-10-CM | POA: Diagnosis present

## 2015-04-14 DIAGNOSIS — C73 Malignant neoplasm of thyroid gland: Secondary | ICD-10-CM

## 2015-04-14 DIAGNOSIS — Z853 Personal history of malignant neoplasm of breast: Secondary | ICD-10-CM

## 2015-04-14 LAB — CBC WITH DIFFERENTIAL (CANCER CENTER ONLY)
BASO#: 0 10*3/uL (ref 0.0–0.2)
BASO%: 0.4 % (ref 0.0–2.0)
EOS%: 0.9 % (ref 0.0–7.0)
Eosinophils Absolute: 0.1 10*3/uL (ref 0.0–0.5)
HCT: 42.1 % (ref 34.8–46.6)
HGB: 14.2 g/dL (ref 11.6–15.9)
LYMPH#: 1.1 10*3/uL (ref 0.9–3.3)
LYMPH%: 16.2 % (ref 14.0–48.0)
MCH: 31.1 pg (ref 26.0–34.0)
MCHC: 33.7 g/dL (ref 32.0–36.0)
MCV: 92 fL (ref 81–101)
MONO#: 0.5 10*3/uL (ref 0.1–0.9)
MONO%: 7.4 % (ref 0.0–13.0)
NEUT%: 75.1 % (ref 39.6–80.0)
NEUTROS ABS: 5.2 10*3/uL (ref 1.5–6.5)
Platelets: 243 10*3/uL (ref 145–400)
RBC: 4.56 10*6/uL (ref 3.70–5.32)
RDW: 14.5 % (ref 11.1–15.7)
WBC: 6.9 10*3/uL (ref 3.9–10.0)

## 2015-04-14 LAB — COMPREHENSIVE METABOLIC PANEL (CC13)
ALT: 17 U/L (ref 0–55)
ANION GAP: 7 meq/L (ref 3–11)
AST: 19 U/L (ref 5–34)
Albumin: 3.5 g/dL (ref 3.5–5.0)
Alkaline Phosphatase: 55 U/L (ref 40–150)
BILIRUBIN TOTAL: 0.52 mg/dL (ref 0.20–1.20)
BUN: 18.9 mg/dL (ref 7.0–26.0)
CALCIUM: 9.5 mg/dL (ref 8.4–10.4)
CO2: 29 meq/L (ref 22–29)
CREATININE: 0.9 mg/dL (ref 0.6–1.1)
Chloride: 101 mEq/L (ref 98–109)
EGFR: 61 mL/min/{1.73_m2} — ABNORMAL LOW (ref >90)
Glucose: 85 mg/dl (ref 70–140)
Potassium: 3.9 mEq/L (ref 3.5–5.1)
Sodium: 136 mEq/L (ref 136–145)
TOTAL PROTEIN: 6.8 g/dL (ref 6.4–8.3)

## 2015-04-14 NOTE — Progress Notes (Signed)
Hematology and Oncology Follow Up Visit  Judy Norman [id number] 1931-10-02 81 y.o. 04/14/2015   Principle Diagnosis:  Stage I (T1 N0 M0) ductal carcinoma of the left breast  Current Therapy:    Observation     Interim History:  Judy Norman is back for her yearly follow-up. She is doing quite good. She had a good year last year.  She's had no problems. She's had no nausea or vomiting. She's had no cough. She's had no shortness of breath. She's had no change in bowel or bladder habits. She is taking Synthroid for hypothyroidism. She is taking vitamin D.  She's had no weight loss or weight gain. Her appetite has been pretty good.  There's been no problems with respect to infections.  Overall, her performance status is ECOG 1.  Medications:  Current outpatient prescriptions:  .  aspirin 81 MG EC tablet, Take 81 mg by mouth daily.  , Disp: , Rfl:  .  calcium carbonate (OS-CAL) 600 MG TABS, Take 600 mg by mouth 2 (two) times daily.  , Disp: , Rfl:  .  cholecalciferol (VITAMIN D) 1000 UNITS tablet, Take 1,000 Units by mouth daily., Disp: , Rfl:  .  enalapril (VASOTEC) 20 MG tablet, Take 1 tablet (20 mg total) by mouth daily., Disp: 90 tablet, Rfl: 0 .  levothyroxine (SYNTHROID, LEVOTHROID) 88 MCG tablet, Take 1 tablet (88 mcg total) by mouth daily., Disp: 90 tablet, Rfl: 1 .  simvastatin (ZOCOR) 40 MG tablet, TAKE 1/2 TABLET BY MOUTH EVERY DAY, Disp: 45 tablet, Rfl: 2 .  triamterene-hydrochlorothiazide (MAXZIDE-25) 37.5-25 MG per tablet, TAKE 1 TABLET BY MOUTH EVERY DAY, Disp: 90 tablet, Rfl: 3  Allergies:  Allergies  Allergen Reactions  . Sulfonamide Derivatives     Past Medical History, Surgical history, Social history, and Family History were reviewed and updated.  Review of Systems: As above  Physical Exam:  height is 5\' 2"  (1.575 m) and weight is 119 lb (53.978 kg). Her oral temperature is 97.5 F (36.4 C). Her blood pressure is 141/64 and her pulse is 63. Her  respiration is 14.   Wt Readings from Last 3 Encounters:  04/14/15 119 lb (53.978 kg)  01/13/15 115 lb 4 oz (52.277 kg)  07/06/14 114 lb 12.8 oz (52.073 kg)     Elderly but well-nourished white female in no obvious distress. Head and neck exam shows no ocular or oral lesions. There are no palpable cervical or supraclavicular lymph nodes. Lungs are clear bilaterally. Cardiac exam regular rate and rhythm with no murmurs, rubs or bruits. Breast exam shows right breast with no masses, edema or erythema. There is no right axillary adenopathy. Left breast shows well-healed lumpectomy at about the 5:00 position. There is no discharge charge from the left nipple. There is no firmness of the left breast. There is no left axillary adenopathy. Abdomen is soft. She has good bowel sounds. There is no fluid wave. There is no palpable liver or spleen tip. Back exam shows some slight kyphosis. Extremities shows no clubbing, cyanosis or edema. Neurological exam shows no focal neurological deficits.  Lab Results  Component Value Date   WBC 6.9 04/14/2015   HGB 14.2 04/14/2015   HCT 42.1 04/14/2015   MCV 92 04/14/2015   PLT 243 04/14/2015     Chemistry      Component Value Date/Time   NA 140 02/14/2015 0916   K 3.8 02/14/2015 0916   CL 101 02/14/2015 0916   CO2 33* 02/14/2015 0916  BUN 18 02/14/2015 0916   CREATININE 0.87 02/14/2015 0916   CREATININE 0.95 12/28/2013 0935      Component Value Date/Time   CALCIUM 9.8 02/14/2015 0916   ALKPHOS 53 02/14/2015 0916   AST 17 02/14/2015 0916   ALT 15 02/14/2015 0916   BILITOT 0.5 02/14/2015 0916         Impression and Plan: Judy Norman is 81 year old white female with a stage I ductal carcinoma of the left breast. She underwent lumpectomy and radiation. She then completed 5 years of Femara in 2013.  I see no evidence of recurrent disease.  We will continue to Have her come back yearly.   Volanda Napoleon, MD 10/13/201610:08 AM

## 2015-05-05 ENCOUNTER — Ambulatory Visit (HOSPITAL_BASED_OUTPATIENT_CLINIC_OR_DEPARTMENT_OTHER)
Admission: RE | Admit: 2015-05-05 | Discharge: 2015-05-05 | Disposition: A | Discharge status: Home / Self Care | Payer: Medicare Other | Source: Ambulatory Visit | Attending: Family Medicine | Admitting: Family Medicine

## 2015-05-05 DIAGNOSIS — Z1231 Encounter for screening mammogram for malignant neoplasm of breast: Secondary | ICD-10-CM | POA: Insufficient documentation

## 2015-06-07 ENCOUNTER — Other Ambulatory Visit: Payer: Self-pay | Admitting: Family Medicine

## 2015-07-15 ENCOUNTER — Other Ambulatory Visit: Payer: Self-pay | Admitting: Family Medicine

## 2015-07-15 ENCOUNTER — Telehealth: Payer: Self-pay | Admitting: Family Medicine

## 2015-07-15 MED ORDER — LEVOTHYROXINE SODIUM 88 MCG PO TABS: 88.0000 ug | ORAL_TABLET | Freq: Every day | ORAL | Status: DC

## 2015-07-25 ENCOUNTER — Encounter: Payer: Self-pay | Admitting: Family Medicine

## 2015-07-25 ENCOUNTER — Ambulatory Visit (INDEPENDENT_AMBULATORY_CARE_PROVIDER_SITE_OTHER): Payer: Medicare Other | Admitting: Family Medicine

## 2015-07-25 VITALS — BP 124/60 | HR 90 | Temp 97.8°F | Ht 62.0 in | Wt 120.5 lb

## 2015-07-25 DIAGNOSIS — M810 Age-related osteoporosis without current pathological fracture: Secondary | ICD-10-CM

## 2015-07-25 DIAGNOSIS — E782 Mixed hyperlipidemia: Secondary | ICD-10-CM | POA: Diagnosis not present

## 2015-07-25 DIAGNOSIS — E538 Deficiency of other specified B group vitamins: Secondary | ICD-10-CM | POA: Diagnosis not present

## 2015-07-25 DIAGNOSIS — Z0000 Encounter for general adult medical examination without abnormal findings: Secondary | ICD-10-CM | POA: Diagnosis not present

## 2015-07-25 DIAGNOSIS — I10 Essential (primary) hypertension: Secondary | ICD-10-CM

## 2015-07-25 DIAGNOSIS — E038 Other specified hypothyroidism: Secondary | ICD-10-CM | POA: Diagnosis not present

## 2015-07-25 LAB — COMPREHENSIVE METABOLIC PANEL
ALT: 14 U/L (ref 0–35)
AST: 17 U/L (ref 0–37)
Albumin: 3.8 g/dL (ref 3.5–5.2)
Alkaline Phosphatase: 57 U/L (ref 39–117)
BUN: 20 mg/dL (ref 6–23)
CALCIUM: 9.4 mg/dL (ref 8.4–10.5)
CHLORIDE: 98 meq/L (ref 96–112)
CO2: 31 meq/L (ref 19–32)
CREATININE: 0.98 mg/dL (ref 0.40–1.20)
GFR: 57.78 mL/min — ABNORMAL LOW (ref >60.00)
Glucose, Bld: 81 mg/dL (ref 70–99)
Potassium: 3.6 mEq/L (ref 3.5–5.1)
SODIUM: 137 meq/L (ref 135–145)
Total Bilirubin: 0.5 mg/dL (ref 0.2–1.2)
Total Protein: 7.1 g/dL (ref 6.0–8.3)

## 2015-07-25 LAB — LIPID PANEL
Cholesterol: 173 mg/dL (ref 0–200)
HDL: 57.1 mg/dL (ref >39.00)
LDL CALC: 100 mg/dL — AB (ref 0–99)
NonHDL: 115.93
TRIGLYCERIDES: 78 mg/dL (ref 0.0–149.0)
Total CHOL/HDL Ratio: 3
VLDL: 15.6 mg/dL (ref 0.0–40.0)

## 2015-07-25 LAB — CBC
HCT: 44.3 % (ref 36.0–46.0)
Hemoglobin: 14.8 g/dL (ref 12.0–15.0)
MCHC: 33.3 g/dL (ref 30.0–36.0)
MCV: 92.8 fl (ref 78.0–100.0)
Platelets: 287 10*3/uL (ref 150.0–400.0)
RBC: 4.77 Mil/uL (ref 3.87–5.11)
RDW: 15 % (ref 11.5–15.5)
WBC: 8.1 10*3/uL (ref 4.0–10.5)

## 2015-07-25 LAB — VITAMIN D 25 HYDROXY (VIT D DEFICIENCY, FRACTURES): VITD: 50.02 ng/mL (ref 30.00–100.00)

## 2015-07-25 LAB — VITAMIN B12: VITAMIN B 12: 282 pg/mL (ref 211–911)

## 2015-07-25 LAB — TSH: TSH: 0.34 u[IU]/mL — AB (ref 0.35–4.50)

## 2015-07-25 NOTE — Progress Notes (Signed)
Subjective:    Patient ID: Judy Norman, female    DOB: Dec 26, 1931, 82 y.o.   MRN: [id number]  Chief Complaint  Patient presents with  . Medicare Wellness    HPI Patient is in today for wellness visit and to follow up on numerous medical conditions. No recent hospitalizations or acute illness. Has been noting a mild sense of her right ear being clogged at times and she thinks she got some soap in it. Denies loss of hearing, tinnitus, pain or itching. Is doing well at home, no difficulties with ADLs. Denies CP/palp/SOB/HA/congestion/fevers/GI or GU c/o. Taking meds as prescribed  Past Medical History  Diagnosis Date  . Hypothyroidism   . Hypertension     benign  . Cancer ([place])     thyroid  . Adenocarcinoma of breast ([place])   . Diverticular disease   . Colon polyps   . Hyperlipidemia   . Glaucoma 07/07/2013  . Preventative health care 07/08/2013  . Medicare annual wellness visit, subsequent 07/06/2014    Past Surgical History  Procedure Laterality Date  . Thyroidectomy    . Breast lumpectomy      Family History  Problem Relation Age of Onset  . Cancer Mother     uterine  . Heart disease Father   . Cancer Brother     ?    Social History   Social History  . Marital Status: Widowed    Spouse Name: N/A  . Number of Children: N/A  . Years of Education: N/A   Occupational History  . housewife    Social History Main Topics  . Smoking status: Never Smoker   . Smokeless tobacco: Never Used     Comment: never used tobacco  . Alcohol Use: No  . Drug Use: No  . Sexual Activity: No   Other Topics Concern  . Not on file   Social History Narrative    Outpatient Prescriptions Prior to Visit  Medication Sig Dispense Refill  . aspirin 81 MG EC tablet Take 81 mg by mouth daily.      . calcium carbonate (OS-CAL) 600 MG TABS Take 600 mg by mouth 2 (two) times daily.      . cholecalciferol (VITAMIN D) 1000 UNITS tablet Take 1,000 Units by mouth daily.    . enalapril  (VASOTEC) 20 MG tablet TAKE 1 TABLET(20 MG) BY MOUTH DAILY 90 tablet 0  . levothyroxine (SYNTHROID, LEVOTHROID) 88 MCG tablet Take 1 tablet (88 mcg total) by mouth daily. 90 tablet 0  . simvastatin (ZOCOR) 40 MG tablet TAKE 1/2 TABLET BY MOUTH EVERY DAY 45 tablet 2  . triamterene-hydrochlorothiazide (MAXZIDE-25) 37.5-25 MG per tablet TAKE 1 TABLET BY MOUTH EVERY DAY 90 tablet 3   No facility-administered medications prior to visit.    Allergies  Allergen Reactions  . Sulfonamide Derivatives     Review of Systems  Constitutional: Negative for fever, chills and malaise/fatigue.  HENT: Negative for congestion and hearing loss.   Eyes: Negative for discharge.  Respiratory: Negative for cough, sputum production and shortness of breath.   Cardiovascular: Negative for chest pain, palpitations and leg swelling.  Gastrointestinal: Negative for heartburn, nausea, vomiting, abdominal pain, diarrhea, constipation and blood in stool.  Genitourinary: Negative for dysuria, urgency, frequency and hematuria.  Musculoskeletal: Negative for myalgias, back pain and falls.  Skin: Negative for rash.  Neurological: Negative for dizziness, sensory change, loss of consciousness, weakness and headaches.  Endo/Heme/Allergies: Negative for environmental allergies. Does not bruise/bleed easily.  Psychiatric/Behavioral: Negative  for depression and suicidal ideas. The patient is not nervous/anxious and does not have insomnia.        Objective:    Physical Exam  Constitutional: She is oriented to person, place, and time. She appears well-developed and well-nourished. No distress.  HENT:  Head: Normocephalic and atraumatic.  Eyes: Conjunctivae are normal.  Neck: Neck supple. No thyromegaly present.  Cardiovascular: Normal rate, regular rhythm and normal heart sounds.   Pulmonary/Chest: Effort normal and breath sounds normal. No respiratory distress.  Abdominal: Soft. Bowel sounds are normal. She exhibits no  distension and no mass. There is no tenderness.  Musculoskeletal: She exhibits no edema.  Lymphadenopathy:    She has no cervical adenopathy.  Neurological: She is alert and oriented to person, place, and time.  Skin: Skin is warm and dry.  Psychiatric: She has a normal mood and affect. Her behavior is normal.    BP 124/60 mmHg  Pulse 90  Temp(Src) 97.8 F (36.6 C) (Oral)  Ht 5' 2"  (1.575 m)  Wt 120 lb 8 oz (54.658 kg)  BMI 22.03 kg/m2  SpO2 98% Wt Readings from Last 3 Encounters:  07/25/15 120 lb 8 oz (54.658 kg)  04/14/15 119 lb (53.978 kg)  01/13/15 115 lb 4 oz (52.277 kg)     Lab Results  Component Value Date   WBC 8.1 07/25/2015   HGB 14.8 07/25/2015   HCT 44.3 07/25/2015   PLT 287.0 07/25/2015   GLUCOSE 81 07/25/2015   CHOL 173 07/25/2015   TRIG 78.0 07/25/2015   HDL 57.10 07/25/2015   LDLCALC 100* 07/25/2015   ALT 14 07/25/2015   AST 17 07/25/2015   NA 137 07/25/2015   K 3.6 07/25/2015   CL 98 07/25/2015   CREATININE 0.98 07/25/2015   BUN 20 07/25/2015   CO2 31 07/25/2015   TSH 0.34* 07/25/2015   INR 0.9 02/06/2009    Lab Results  Component Value Date   TSH 0.34* 07/25/2015   Lab Results  Component Value Date   WBC 8.1 07/25/2015   HGB 14.8 07/25/2015   HCT 44.3 07/25/2015   MCV 92.8 07/25/2015   PLT 287.0 07/25/2015   Lab Results  Component Value Date   NA 137 07/25/2015   K 3.6 07/25/2015   CHLORIDE 101 04/14/2015   CO2 31 07/25/2015   GLUCOSE 81 07/25/2015   BUN 20 07/25/2015   CREATININE 0.98 07/25/2015   BILITOT 0.5 07/25/2015   ALKPHOS 57 07/25/2015   AST 17 07/25/2015   ALT 14 07/25/2015   PROT 7.1 07/25/2015   ALBUMIN 3.8 07/25/2015   CALCIUM 9.4 07/25/2015   ANIONGAP 7 04/14/2015   EGFR 61* 04/14/2015   GFR 57.78* 07/25/2015   Lab Results  Component Value Date   CHOL 173 07/25/2015   Lab Results  Component Value Date   HDL 57.10 07/25/2015   Lab Results  Component Value Date   LDLCALC 100* 07/25/2015   Lab  Results  Component Value Date   TRIG 78.0 07/25/2015   Lab Results  Component Value Date   CHOLHDL 3 07/25/2015   No results found for: HGBA1C     Assessment & Plan:   Problem List Items Addressed This Visit    B12 deficiency   Relevant Orders   Vitamin B12 (Completed)   CBC   Vitamin B12   Hyperlipidemia, mixed    Tolerating statin, encouraged heart healthy diet, avoid trans fats, minimize simple carbs and saturated fats. Increase exercise as tolerated  Relevant Orders   Lipid panel (Completed)   Lipid panel   HYPERTENSION, BENIGN    Well controlled, no changes to meds. Encouraged heart healthy diet such as the DASH diet and exercise as tolerated.       Relevant Orders   Comprehensive metabolic panel (Completed)   CBC (Completed)   Comprehensive metabolic panel   Hypothyroidism - Primary (Chronic)    Mild suppression of TSH, patient asymptomatic will continue to follow      Relevant Orders   TSH (Completed)   TSH   Medicare annual wellness visit, subsequent    Patient denies any difficulties at home. No trouble with ADLs, depression or falls. See EMR for functional status screen and depression screen. No recent changes to vision or hearing. Is UTD with immunizations. Is UTD with screening. Discussed Advanced Directives. Encouraged heart healthy diet, exercise as tolerated and adequate sleep. See patient's problem list for health risk factors to monitor. See AVS for preventative healthcare recommendation schedule. Labs ordered and reviewed Sees Dr Len Childs of opthamology, [facility] and Dr Berline Chough Dr Marin Olp of oncology MGM and Dexa scan in 2015 Colonoscopy in 2015 with [facility] gastro[facility]ology, no further colonoscopies recommended for screening purposes See care team function for further specialists      Osteoporosis    Vitamin D WNL today, continue supplements, stay active and monitor, calcium 1500 mg daily divided tid      Relevant Orders   VITAMIN D  25 Hydroxy (Vit-D Deficiency, Fractures) (Completed)   VITAMIN D 25 Hydroxy (Vit-D Deficiency, Fractures)      I am having Ms. Arns maintain her aspirin, calcium carbonate, cholecalciferol, triamterene-hydrochlorothiazide, simvastatin, enalapril, and levothyroxine.  No orders of the defined types were placed in this encounter.     Willette Alma, MD

## 2015-07-25 NOTE — Patient Instructions (Addendum)
Preventive Care for Adults, Female A healthy lifestyle and preventive care can promote health and wellness. Preventive health guidelines for women include the following key practices.  A routine yearly physical is a good way to check with your health care provider about your health and preventive screening. It is a chance to share any concerns and updates on your health and to receive a thorough exam.  Visit your dentist for a routine exam and preventive care every 6 months. Brush your teeth twice a day and floss once a day. Good oral hygiene prevents tooth decay and gum disease.  The frequency of eye exams is based on your age, health, family medical history, use of contact lenses, and other factors. Follow your health care provider's recommendations for frequency of eye exams.  Eat a healthy diet. Foods like vegetables, fruits, whole grains, low-fat dairy products, and lean protein foods contain the nutrients you need without too many calories. Decrease your intake of foods high in solid fats, added sugars, and salt. Eat the right amount of calories for you.Get information about a proper diet from your health care provider, if necessary.  Regular physical exercise is one of the most important things you can do for your health. Most adults should get at least 150 minutes of moderate-intensity exercise (any activity that increases your heart rate and causes you to sweat) each week. In addition, most adults need muscle-strengthening exercises on 2 or more days a week.  Maintain a healthy weight. The body mass index (BMI) is a screening tool to identify possible weight problems. It provides an estimate of body fat based on height and weight. Your health care provider can find your BMI and can help you achieve or maintain a healthy weight.For adults 20 years and older:  A BMI below 18.5 is considered underweight.  A BMI of 18.5 to 24.9 is normal.  A BMI of 25 to 29.9 is considered overweight.  A  BMI of 30 and above is considered obese.  Maintain normal blood lipids and cholesterol levels by exercising and minimizing your intake of saturated fat. Eat a balanced diet with plenty of fruit and vegetables. Blood tests for lipids and cholesterol should begin at age 20 and be repeated every 5 years. If your lipid or cholesterol levels are high, you are over 50, or you are at high risk for heart disease, you may need your cholesterol levels checked more frequently.Ongoing high lipid and cholesterol levels should be treated with medicines if diet and exercise are not working.  If you smoke, find out from your health care provider how to quit. If you do not use tobacco, do not start.  Lung cancer screening is recommended for adults aged 55-80 years who are at high risk for developing lung cancer because of a history of smoking. A yearly low-dose CT scan of the lungs is recommended for people who have at least a 30-pack-year history of smoking and are a current smoker or have quit within the past 15 years. A pack year of smoking is smoking an average of 1 pack of cigarettes a day for 1 year (for example: 1 pack a day for 30 years or 2 packs a day for 15 years). Yearly screening should continue until the smoker has stopped smoking for at least 15 years. Yearly screening should be stopped for people who develop a health problem that would prevent them from having lung cancer treatment.  If you are pregnant, do not drink alcohol. If you are   are breastfeeding, be very cautious about drinking alcohol. If you are not pregnant and choose to drink alcohol, do not have more than 1 drink per day. One drink is considered to be 12 ounces (355 mL) of beer, 5 ounces (148 mL) of wine, or 1.5 ounces (44 mL) of liquor.  Avoid use of street drugs. Do not share needles with anyone. Ask for help if you need support or instructions about stopping the use of drugs.  High blood pressure causes heart disease and  increases the risk of stroke. Your blood pressure should be checked at least every 1 to 2 years. Ongoing high blood pressure should be treated with medicines if weight loss and exercise do not work.  If you are 12-58 years old, ask your health care provider if you should take aspirin to prevent strokes.  Diabetes screening is done by taking a blood sample to check your blood glucose level after you have not eaten for a certain period of time (fasting). If you are not overweight and you do not have risk factors for diabetes, you should be screened once every 3 years starting at age 76. If you are overweight or obese and you are 18-1 years of age, you should be screened for diabetes every year as part of your cardiovascular risk assessment.  Breast cancer screening is essential preventive care for women. You should practice "breast self-awareness." This means understanding the normal appearance and feel of your breasts and may include breast self-examination. Any changes detected, no matter how small, should be reported to a health care provider. Women in their 73s and 30s should have a clinical breast exam (CBE) by a health care provider as part of a regular health exam every 1 to 3 years. After age 36, women should have a CBE every year. Starting at age 50, women should consider having a mammogram (breast X-ray test) every year. Women who have a family history of breast cancer should talk to their health care provider about genetic screening. Women at a high risk of breast cancer should talk to their health care providers about having an MRI and a mammogram every year.  Breast cancer gene (BRCA)-related cancer risk assessment is recommended for women who have family members with BRCA-related cancers. BRCA-related cancers include breast, ovarian, tubal, and peritoneal cancers. Having family members with these cancers may be associated with an increased risk for harmful changes (mutations) in the breast  cancer genes BRCA1 and BRCA2. Results of the assessment will determine the need for genetic counseling and BRCA1 and BRCA2 testing.  Your health care provider may recommend that you be screened regularly for cancer of the pelvic organs (ovaries, uterus, and vagina). This screening involves a pelvic examination, including checking for microscopic changes to the surface of your cervix (Pap test). You may be encouraged to have this screening done every 3 years, beginning at age 40.  For women ages 32-65, health care providers may recommend pelvic exams and Pap testing every 3 years, or they may recommend the Pap and pelvic exam, combined with testing for human papilloma virus (HPV), every 5 years. Some types of HPV increase your risk of cervical cancer. Testing for HPV may also be done on women of any age with unclear Pap test results.  Other health care providers may not recommend any screening for nonpregnant women who are considered low risk for pelvic cancer and who do not have symptoms. Ask your health care provider if a screening pelvic exam is right  you.  If you have had past treatment for cervical cancer or a condition that could lead to cancer, you need Pap tests and screening for cancer for at least 20 years after your treatment. If Pap tests have been discontinued, your risk factors (such as having a new sexual partner) need to be reassessed to determine if screening should resume. Some women have medical problems that increase the chance of getting cervical cancer. In these cases, your health care provider may recommend more frequent screening and Pap tests.  Colorectal cancer can be detected and often prevented. Most routine colorectal cancer screening begins at the age of 50 years and continues through age 75 years. However, your health care provider may recommend screening at an earlier age if you have risk factors for colon cancer. On a yearly basis, your health care provider may provide home test kits to check  for hidden blood in the stool. Use of a small camera at the end of a tube, to directly examine the colon (sigmoidoscopy or colonoscopy), can detect the earliest forms of colorectal cancer. Talk to your health care provider about this at age 50, when routine screening begins. Direct exam of the colon should be repeated every 5-10 years through age 75 years, unless early forms of precancerous polyps or small growths are found.  People who are at an increased risk for hepatitis B should be screened for this virus. You are considered at high risk for hepatitis B if:  You were born in a country where hepatitis B occurs often. Talk with your health care provider about which countries are considered high risk.  Your parents were born in a high-risk country and you have not received a shot to protect against hepatitis B (hepatitis B vaccine).  You have HIV or AIDS.  You use needles to inject street drugs.  You live with, or have sex with, someone who has hepatitis B.  You get hemodialysis treatment.  You take certain medicines for conditions like cancer, organ transplantation, and autoimmune conditions.  Hepatitis C blood testing is recommended for all people born from 1945 through 1965 and any individual with known risks for hepatitis C.  Practice safe sex. Use condoms and avoid high-risk sexual practices to reduce the spread of sexually transmitted infections (STIs). STIs include gonorrhea, chlamydia, syphilis, trichomonas, herpes, HPV, and human immunodeficiency virus (HIV). Herpes, HIV, and HPV are viral illnesses that have no cure. They can result in disability, cancer, and death.  You should be screened for sexually transmitted illnesses (STIs) including gonorrhea and chlamydia if:  You are sexually active and are younger than 24 years.  You are older than 24 years and your health care provider tells you that you are at risk for this type of infection.  Your sexual activity has changed  since you were last screened and you are at an increased risk for chlamydia or gonorrhea. Ask your health care provider if you are at risk.  If you are at risk of being infected with HIV, it is recommended that you take a prescription medicine daily to prevent HIV infection. This is called preexposure prophylaxis (PrEP). You are considered at risk if:  You are sexually active and do not regularly use condoms or know the HIV status of your partner(s).  You take drugs by injection.  You are sexually active with a partner who has HIV.  Talk with your health care provider about whether you are at high risk of being infected with HIV. If   you choose to begin PrEP, you should first be tested for HIV. You should then be tested every 3 months for as long as you are taking PrEP.  Osteoporosis is a disease in which the bones lose minerals and strength with aging. This can result in serious bone fractures or breaks. The risk of osteoporosis can be identified using a bone density scan. Women ages 65 years and over and women at risk for fractures or osteoporosis should discuss screening with their health care providers. Ask your health care provider whether you should take a calcium supplement or vitamin D to reduce the rate of osteoporosis.  Menopause can be associated with physical symptoms and risks. Hormone replacement therapy is available to decrease symptoms and risks. You should talk to your health care provider about whether hormone replacement therapy is right for you.  Use sunscreen. Apply sunscreen liberally and repeatedly throughout the day. You should seek shade when your shadow is shorter than you. Protect yourself by wearing long sleeves, pants, a wide-brimmed hat, and sunglasses year round, whenever you are outdoors.  Once a month, do a whole body skin exam, using a mirror to look at the skin on your back. Tell your health care provider of new moles, moles that have irregular borders, moles that  are larger than a pencil eraser, or moles that have changed in shape or color.  Stay current with required vaccines (immunizations).  Influenza vaccine. All adults should be immunized every year.  Tetanus, diphtheria, and acellular pertussis (Td, Tdap) vaccine. Pregnant women should receive 1 dose of Tdap vaccine during each pregnancy. The dose should be obtained regardless of the length of time since the last dose. Immunization is preferred during the 27th-36th week of gestation. An adult who has not previously received Tdap or who does not know her vaccine status should receive 1 dose of Tdap. This initial dose should be followed by tetanus and diphtheria toxoids (Td) booster doses every 10 years. Adults with an unknown or incomplete history of completing a 3-dose immunization series with Td-containing vaccines should begin or complete a primary immunization series including a Tdap dose. Adults should receive a Td booster every 10 years.  Varicella vaccine. An adult without evidence of immunity to varicella should receive 2 doses or a second dose if she has previously received 1 dose. Pregnant females who do not have evidence of immunity should receive the first dose after pregnancy. This first dose should be obtained before leaving the health care facility. The second dose should be obtained 4-8 weeks after the first dose.  Human papillomavirus (HPV) vaccine. Females aged 13-26 years who have not received the vaccine previously should obtain the 3-dose series. The vaccine is not recommended for use in pregnant females. However, pregnancy testing is not needed before receiving a dose. If a female is found to be pregnant after receiving a dose, no treatment is needed. In that case, the remaining doses should be delayed until after the pregnancy. Immunization is recommended for any person with an immunocompromised condition through the age of 26 years if she did not get any or all doses earlier. During the  3-dose series, the second dose should be obtained 4-8 weeks after the first dose. The third dose should be obtained 24 weeks after the first dose and 16 weeks after the second dose.  Zoster vaccine. One dose is recommended for adults aged 60 years or older unless certain conditions are present.  Measles, mumps, and rubella (MMR) vaccine. Adults born   born before [birth year] generally are considered immune to measles and mumps. Adults born in [birth year] or later should have 1 or more doses of MMR vaccine unless there is a contraindication to the vaccine or there is laboratory evidence of immunity to each of the three diseases. A routine second dose of MMR vaccine should be obtained at least 28 days after the first dose for students attending postsecondary schools, health care workers, or international travelers. People who received inactivated measles vaccine or an unknown type of measles vaccine during 1963-1967 should receive 2 doses of MMR vaccine. People who received inactivated mumps vaccine or an unknown type of mumps vaccine before 1979 and are at high risk for mumps infection should consider immunization with 2 doses of MMR vaccine. For females of childbearing age, rubella immunity should be determined. If there is no evidence of immunity, females who are not pregnant should be vaccinated. If there is no evidence of immunity, females who are pregnant should delay immunization until after pregnancy. Unvaccinated health care workers born before [birth year] who lack laboratory evidence of measles, mumps, or rubella immunity or laboratory confirmation of disease should consider measles and mumps immunization with 2 doses of MMR vaccine or rubella immunization with 1 dose of MMR vaccine.  Pneumococcal 13-valent conjugate (PCV13) vaccine. When indicated, a person who is uncertain of his immunization history and has no record of immunization should receive the PCV13 vaccine. All adults 62 years of age and older  should receive this vaccine. An adult aged 39 years or older who has certain medical conditions and has not been previously immunized should receive 1 dose of PCV13 vaccine. This PCV13 should be followed with a dose of pneumococcal polysaccharide (PPSV23) vaccine. Adults who are at high risk for pneumococcal disease should obtain the PPSV23 vaccine at least 8 weeks after the dose of PCV13 vaccine. Adults older than 80 years of age who have normal immune system function should obtain the PPSV23 vaccine dose at least 1 year after the dose of PCV13 vaccine.  Pneumococcal polysaccharide (PPSV23) vaccine. When PCV13 is also indicated, PCV13 should be obtained first. All adults aged 47 years and older should be immunized. An adult younger than age 27 years who has certain medical conditions should be immunized. Any person who resides in a nursing home or long-term care facility should be immunized. An adult smoker should be immunized. People with an immunocompromised condition and certain other conditions should receive both PCV13 and PPSV23 vaccines. People with human immunodeficiency virus (HIV) infection should be immunized as soon as possible after diagnosis. Immunization during chemotherapy or radiation therapy should be avoided. Routine use of PPSV23 vaccine is not recommended for American Indians, [place] Natives, or people younger than 65 years unless there are medical conditions that require PPSV23 vaccine. When indicated, people who have unknown immunization and have no record of immunization should receive PPSV23 vaccine. One-time revaccination 5 years after the first dose of PPSV23 is recommended for people aged 19-64 years who have chronic kidney failure, nephrotic syndrome, asplenia, or immunocompromised conditions. People who received 1-2 doses of PPSV23 before age 57 years should receive another dose of PPSV23 vaccine at age 42 years or later if at least 5 years have passed since the previous dose. Doses  of PPSV23 are not needed for people immunized with PPSV23 at or after age 51 years.  Meningococcal vaccine. Adults with asplenia or persistent complement component deficiencies should receive 2 doses of quadrivalent meningococcal conjugate (MenACWY-D) vaccine. The doses should be  at least 2 months apart. Microbiologists working with certain meningococcal bacteria, military recruits, people at risk during an outbreak, and people who travel to or live in countries with a high rate of meningitis should be immunized. A first-year college student up through age 21 years who is living in a residence hall should receive a dose if she did not receive a dose on or after her 16th birthday. Adults who have certain high-risk conditions should receive one or more doses of vaccine.  Hepatitis A vaccine. Adults who wish to be protected from this disease, have certain high-risk conditions, work with hepatitis A-infected animals, work in hepatitis A research labs, or travel to or work in countries with a high rate of hepatitis A should be immunized. Adults who were previously unvaccinated and who anticipate close contact with an international adoptee during the first 60 days after arrival in the [place] from a country with a high rate of hepatitis A should be immunized.  Hepatitis B vaccine. Adults who wish to be protected from this disease, have certain high-risk conditions, may be exposed to blood or other infectious body fluids, are household contacts or sex partners of hepatitis B positive people, are clients or workers in certain care facilities, or travel to or work in countries with a high rate of hepatitis B should be immunized.  Haemophilus influenzae type b (Hib) vaccine. A previously unvaccinated person with asplenia or sickle cell disease or having a scheduled splenectomy should receive 1 dose of Hib vaccine. Regardless of previous immunization, a recipient of a hematopoietic stem cell transplant should receive a  3-dose series 6-12 months after her successful transplant. Hib vaccine is not recommended for adults with HIV infection. Preventive Services / Frequency Ages 19 to 39 years  Blood pressure check.** / Every 3-5 years.  Lipid and cholesterol check.** / Every 5 years beginning at age 20.  Clinical breast exam.** / Every 3 years for women in their 20s and 30s.  BRCA-related cancer risk assessment.** / For women who have family members with a BRCA-related cancer (breast, ovarian, tubal, or peritoneal cancers).  Pap test.** / Every 2 years from ages 21 through 29. Every 3 years starting at age 30 through age 65 or 70 with a history of 3 consecutive normal Pap tests.  HPV screening.** / Every 3 years from ages 30 through ages 65 to 70 with a history of 3 consecutive normal Pap tests.  Hepatitis C blood test.** / For any individual with known risks for hepatitis C.  Skin self-exam. / Monthly.  Influenza vaccine. / Every year.  Tetanus, diphtheria, and acellular pertussis (Tdap, Td) vaccine.** / Consult your health care provider. Pregnant women should receive 1 dose of Tdap vaccine during each pregnancy. 1 dose of Td every 10 years.  Varicella vaccine.** / Consult your health care provider. Pregnant females who do not have evidence of immunity should receive the first dose after pregnancy.  HPV vaccine. / 3 doses over 6 months, if 26 and younger. The vaccine is not recommended for use in pregnant females. However, pregnancy testing is not needed before receiving a dose.  Measles, mumps, rubella (MMR) vaccine.** / You need at least 1 dose of MMR if you were born in 1957 or later. You may also need a 2nd dose. For females of childbearing age, rubella immunity should be determined. If there is no evidence of immunity, females who are not pregnant should be vaccinated. If there is no evidence of immunity, females who are   pregnant should delay immunization until after pregnancy.  Pneumococcal  13-valent conjugate (PCV13) vaccine.** / Consult your health care provider.  Pneumococcal polysaccharide (PPSV23) vaccine.** / 1 to 2 doses if you smoke cigarettes or if you have certain conditions.  Meningococcal vaccine.** / 1 dose if you are age 19 to 21 years and a first-year college student living in a residence hall, or have one of several medical conditions, you need to get vaccinated against meningococcal disease. You may also need additional booster doses.  Hepatitis A vaccine.** / Consult your health care provider.  Hepatitis B vaccine.** / Consult your health care provider.  Haemophilus influenzae type b (Hib) vaccine.** / Consult your health care provider. Ages 40 to 64 years  Blood pressure check.** / Every year.  Lipid and cholesterol check.** / Every 5 years beginning at age 20 years.  Lung cancer screening. / Every year if you are aged 55-80 years and have a 30-pack-year history of smoking and currently smoke or have quit within the past 15 years. Yearly screening is stopped once you have quit smoking for at least 15 years or develop a health problem that would prevent you from having lung cancer treatment.  Clinical breast exam.** / Every year after age 40 years.  BRCA-related cancer risk assessment.** / For women who have family members with a BRCA-related cancer (breast, ovarian, tubal, or peritoneal cancers).  Mammogram.** / Every year beginning at age 40 years and continuing for as long as you are in good health. Consult with your health care provider.  Pap test.** / Every 3 years starting at age 30 years through age 65 or 70 years with a history of 3 consecutive normal Pap tests.  HPV screening.** / Every 3 years from ages 30 years through ages 65 to 70 years with a history of 3 consecutive normal Pap tests.  Fecal occult blood test (FOBT) of stool. / Every year beginning at age 50 years and continuing until age 75 years. You may not need to do this test if you get  a colonoscopy every 10 years.  Flexible sigmoidoscopy or colonoscopy.** / Every 5 years for a flexible sigmoidoscopy or every 10 years for a colonoscopy beginning at age 50 years and continuing until age 75 years.  Hepatitis C blood test.** / For all people born from 1945 through 1965 and any individual with known risks for hepatitis C.  Skin self-exam. / Monthly.  Influenza vaccine. / Every year.  Tetanus, diphtheria, and acellular pertussis (Tdap/Td) vaccine.** / Consult your health care provider. Pregnant women should receive 1 dose of Tdap vaccine during each pregnancy. 1 dose of Td every 10 years.  Varicella vaccine.** / Consult your health care provider. Pregnant females who do not have evidence of immunity should receive the first dose after pregnancy.  Zoster vaccine.** / 1 dose for adults aged 60 years or older.  Measles, mumps, rubella (MMR) vaccine.** / You need at least 1 dose of MMR if you were born in 1957 or later. You may also need a second dose. For females of childbearing age, rubella immunity should be determined. If there is no evidence of immunity, females who are not pregnant should be vaccinated. If there is no evidence of immunity, females who are pregnant should delay immunization until after pregnancy.  Pneumococcal 13-valent conjugate (PCV13) vaccine.** / Consult your health care provider.  Pneumococcal polysaccharide (PPSV23) vaccine.** / 1 to 2 doses if you smoke cigarettes or if you have certain conditions.  Meningococcal vaccine.** /   Consult your health care provider.  Hepatitis A vaccine.** / Consult your health care provider.  Hepatitis B vaccine.** / Consult your health care provider.  Haemophilus influenzae type b (Hib) vaccine.** / Consult your health care provider. Ages 80 years and over  Blood pressure check.** / Every year.  Lipid and cholesterol check.** / Every 5 years beginning at age 62 years.  Lung cancer  screening. / Every year if you are aged 32-80 years and have a 30-pack-year history of smoking and currently smoke or have quit within the past 15 years. Yearly screening is stopped once you have quit smoking for at least 15 years or develop a health problem that would prevent you from having lung cancer treatment.  Clinical breast exam.** / Every year after age 61 years.  BRCA-related cancer risk assessment.** / For women who have family members with a BRCA-related cancer (breast, ovarian, tubal, or peritoneal cancers).  Mammogram.** / Every year beginning at age 39 years and continuing for as long as you are in good health. Consult with your health care provider.  Pap test.** / Every 3 years starting at age 85 years through age 74 or 72 years with 3 consecutive normal Pap tests. Testing can be stopped between 65 and 70 years with 3 consecutive normal Pap tests and no abnormal Pap or HPV tests in the past 10 years.  HPV screening.** / Every 3 years from ages 55 years through ages 67 or 77 years with a history of 3 consecutive normal Pap tests. Testing can be stopped between 65 and 70 years with 3 consecutive normal Pap tests and no abnormal Pap or HPV tests in the past 10 years.  Fecal occult blood test (FOBT) of stool. / Every year beginning at age 81 years and continuing until age 22 years. You may not need to do this test if you get a colonoscopy every 10 years.  Flexible sigmoidoscopy or colonoscopy.** / Every 5 years for a flexible sigmoidoscopy or every 10 years for a colonoscopy beginning at age 67 years and continuing until age 22 years.  Hepatitis C blood test.** / For all people born from [birth year] through 1963 and any individual with known risks for hepatitis C.  Osteoporosis screening.** / A one-time screening for women ages 8 years and over and women at risk for fractures or osteoporosis.  Skin self-exam. / Monthly.  Influenza vaccine. / Every year.  Tetanus, diphtheria, and  acellular pertussis (Tdap/Td) vaccine.** / 1 dose of Td every 10 years.  Varicella vaccine.** / Consult your health care provider.  Zoster vaccine.** / 1 dose for adults aged 56 years or older.  Pneumococcal 13-valent conjugate (PCV13) vaccine.** / Consult your health care provider.  Pneumococcal polysaccharide (PPSV23) vaccine.** / 1 dose for all adults aged 15 years and older.  Meningococcal vaccine.** / Consult your health care provider.  Hepatitis A vaccine.** / Consult your health care provider.  Hepatitis B vaccine.** / Consult your health care provider.  Haemophilus influenzae type b (Hib) vaccine.** / Consult your health care provider. ** Family history and personal history of risk and conditions may change your health care provider's recommendations.   This information is not intended to replace advice given to you by your health care provider. Make sure you discuss any questions you have with your health care provider.   Document Released: 08/14/2001 Document Revised: 07/09/2014 Document Reviewed: 11/13/2010 Elsevier Interactive Patient Education [employer].

## 2015-07-25 NOTE — Progress Notes (Signed)
Pre visit review using our clinic review tool, if applicable. No additional management support is needed unless otherwise documented below in the visit note. 

## 2015-08-07 ENCOUNTER — Encounter: Payer: Self-pay | Admitting: Family Medicine

## 2015-08-07 NOTE — Assessment & Plan Note (Signed)
Patient denies any difficulties at home. No trouble with ADLs, depression or falls. See EMR for functional status screen and depression screen. No recent changes to vision or hearing. Is UTD with immunizations. Is UTD with screening. Discussed Advanced Directives. Encouraged heart healthy diet, exercise as tolerated and adequate sleep. See patient's problem list for health risk factors to monitor. See AVS for preventative healthcare recommendation schedule. Labs ordered and reviewed Sees Dr Len Childs of opthamology, [facility] and Dr Berline Chough Dr Marin Olp of oncology [facility] and Dexa scan in 2015 Colonoscopy in 2015 with [facility] gastroenterology, no further colonoscopies recommended for screening purposes See care team function for further specialists

## 2015-08-07 NOTE — Assessment & Plan Note (Signed)
Tolerating statin, encouraged heart healthy diet, avoid trans fats, minimize simple carbs and saturated fats. Increase exercise as tolerated 

## 2015-08-07 NOTE — Assessment & Plan Note (Signed)
Well controlled, no changes to meds. Encouraged heart healthy diet such as the DASH diet and exercise as tolerated.  °

## 2015-08-07 NOTE — Assessment & Plan Note (Signed)
Vitamin D WNL today, continue supplements, stay active and monitor, calcium 1500 mg daily divided tid

## 2015-08-07 NOTE — Assessment & Plan Note (Signed)
Mild suppression of TSH, patient asymptomatic will continue to follow

## 2015-08-08 ENCOUNTER — Other Ambulatory Visit: Payer: Self-pay | Admitting: Family Medicine

## 2015-08-08 MED ORDER — TRIAMTERENE-HCTZ 37.5-25 MG PO TABS: 1.0000 | ORAL_TABLET | Freq: Every day | ORAL | Status: DC

## 2015-09-05 ENCOUNTER — Other Ambulatory Visit: Payer: Self-pay | Admitting: Family Medicine

## 2015-09-05 MED ORDER — ENALAPRIL MALEATE 20 MG PO TABS: ORAL_TABLET | ORAL | Status: AC

## 2015-09-05 MED ORDER — SIMVASTATIN 40 MG PO TABS: 20.0000 mg | ORAL_TABLET | Freq: Every day | ORAL | Status: AC

## 2015-10-06 ENCOUNTER — Telehealth: Payer: Self-pay | Admitting: Family Medicine

## 2015-10-06 ENCOUNTER — Other Ambulatory Visit: Payer: Self-pay | Admitting: Family Medicine

## 2015-10-06 MED ORDER — TRIAMTERENE-HCTZ 37.5-25 MG PO TABS: 1.0000 | ORAL_TABLET | Freq: Every day | ORAL | Status: AC

## 2015-10-06 NOTE — Telephone Encounter (Signed)
Prescription sent in  

## 2015-10-06 NOTE — Telephone Encounter (Signed)
Patient states she went to pick up her medications and the triamterene-hydrochlorothiazide (MAXZIDE-25) 37.5-25 MG tablet [id number] had not been called in. Request it be called in and she will pick them both up later today at [facility] on [address]

## 2015-10-10 ENCOUNTER — Telehealth: Payer: Self-pay | Admitting: Family Medicine

## 2015-10-10 NOTE — Telephone Encounter (Signed)
Pt is requesting her medical records. Pt says that she is leaving the [place].

## 2015-10-10 NOTE — Telephone Encounter (Signed)
Faxed over request form to Medical Records . Pt would like to pick up from Medical Records.

## 2015-10-27 NOTE — Telephone Encounter (Signed)
Error/gd °

## 2016-01-18 ENCOUNTER — Other Ambulatory Visit: Payer: Medicare Other

## 2016-01-23 ENCOUNTER — Ambulatory Visit: Payer: Medicare Other | Admitting: Family Medicine

## 2016-01-23 DIAGNOSIS — Z0289 Encounter for other administrative examinations: Secondary | ICD-10-CM

## 2016-04-13 ENCOUNTER — Other Ambulatory Visit: Payer: Medicare Other

## 2016-04-13 ENCOUNTER — Ambulatory Visit: Payer: Medicare Other | Admitting: Family
